# Patient Record
Sex: Male | Born: 1995 | Race: White | Hispanic: No | Marital: Single | Smoking: Current every day smoker
Health system: Southern US, Community
[De-identification: ages and names within clinical notes are randomized; demographics above are authoritative.]

---
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
## 2014-09-12 ENCOUNTER — Other Ambulatory Visit: Payer: Self-pay | Admitting: Orthopaedic Surgery

## 2014-09-12 DIAGNOSIS — M545 Low back pain, unspecified: Secondary | ICD-10-CM

## 2014-09-12 DIAGNOSIS — G8929 Other chronic pain: Secondary | ICD-10-CM

## 2014-09-12 DIAGNOSIS — M79604 Pain in right leg: Secondary | ICD-10-CM

## 2014-10-04 ENCOUNTER — Ambulatory Visit
Admission: RE | Admit: 2014-10-04 | Discharge: 2014-10-04 | Disposition: A | Discharge status: Home / Self Care | Payer: Worker's Compensation | Source: Ambulatory Visit | Attending: Orthopaedic Surgery | Admitting: Orthopaedic Surgery

## 2014-10-04 VITALS — BP 172/93 | HR 70

## 2014-10-04 DIAGNOSIS — M545 Low back pain: Secondary | ICD-10-CM

## 2014-10-04 DIAGNOSIS — M79604 Pain in right leg: Secondary | ICD-10-CM

## 2014-10-04 DIAGNOSIS — G8929 Other chronic pain: Secondary | ICD-10-CM

## 2014-10-04 DIAGNOSIS — M5416 Radiculopathy, lumbar region: Secondary | ICD-10-CM

## 2014-10-04 MED ORDER — METHYLPREDNISOLONE ACETATE 40 MG/ML INJ SUSP (RADIOLOG
120.0000 mg | Freq: Once | INTRAMUSCULAR | Status: AC
  Administered 2014-10-04: 120 mg via EPIDURAL

## 2014-10-04 MED ORDER — IOHEXOL 180 MG/ML  SOLN
1.0000 mL | Freq: Once | INTRAMUSCULAR | Status: DC | PRN
  Administered 2014-10-04: 1 mL via EPIDURAL

## 2014-10-04 NOTE — Discharge Instructions (Signed)

## 2017-02-07 ENCOUNTER — Encounter: Payer: Self-pay | Admitting: Family Medicine

## 2017-04-27 ENCOUNTER — Ambulatory Visit: Payer: Self-pay | Admitting: Family Medicine

## 2019-03-18 ENCOUNTER — Emergency Department (HOSPITAL_COMMUNITY): Payer: Self-pay

## 2019-03-18 ENCOUNTER — Encounter (HOSPITAL_COMMUNITY): Payer: Self-pay

## 2019-03-18 ENCOUNTER — Emergency Department (HOSPITAL_COMMUNITY)
Admission: EM | Admit: 2019-03-18 | Discharge: 2019-03-19 | Disposition: A | Discharge status: Other Acute Inpatient Hospital | Payer: Self-pay | Source: Home / Self Care | Attending: Emergency Medicine | Admitting: Emergency Medicine

## 2019-03-18 ENCOUNTER — Other Ambulatory Visit: Payer: Self-pay

## 2019-03-18 ENCOUNTER — Emergency Department (HOSPITAL_COMMUNITY): Payer: BC Managed Care – PPO

## 2019-03-18 DIAGNOSIS — F19959 Other psychoactive substance use, unspecified with psychoactive substance-induced psychotic disorder, unspecified: Secondary | ICD-10-CM

## 2019-03-18 DIAGNOSIS — F1910 Other psychoactive substance abuse, uncomplicated: Secondary | ICD-10-CM | POA: Insufficient documentation

## 2019-03-18 DIAGNOSIS — Z20822 Contact with and (suspected) exposure to covid-19: Secondary | ICD-10-CM | POA: Insufficient documentation

## 2019-03-18 DIAGNOSIS — F061 Catatonic disorder due to known physiological condition: Secondary | ICD-10-CM

## 2019-03-18 DIAGNOSIS — F54 Psychological and behavioral factors associated with disorders or diseases classified elsewhere: Secondary | ICD-10-CM | POA: Insufficient documentation

## 2019-03-18 DIAGNOSIS — R4182 Altered mental status, unspecified: Secondary | ICD-10-CM | POA: Insufficient documentation

## 2019-03-18 DIAGNOSIS — Z79899 Other long term (current) drug therapy: Secondary | ICD-10-CM | POA: Insufficient documentation

## 2019-03-18 DIAGNOSIS — F17210 Nicotine dependence, cigarettes, uncomplicated: Secondary | ICD-10-CM | POA: Insufficient documentation

## 2019-03-18 DIAGNOSIS — F29 Unspecified psychosis not due to a substance or known physiological condition: Secondary | ICD-10-CM | POA: Insufficient documentation

## 2019-03-18 LAB — RESPIRATORY PANEL BY RT PCR (FLU A&B, COVID)
Influenza A by PCR: NEGATIVE
Influenza B by PCR: NEGATIVE
SARS Coronavirus 2 by RT PCR: NEGATIVE

## 2019-03-18 LAB — CBC WITH DIFFERENTIAL/PLATELET
Abs Immature Granulocytes: 0.05 10*3/uL (ref 0.00–0.07)
Basophils Absolute: 0.1 10*3/uL (ref 0.0–0.1)
Basophils Relative: 1 %
Eosinophils Absolute: 0 10*3/uL (ref 0.0–0.5)
Eosinophils Relative: 0 %
HCT: 47.3 % (ref 39.0–52.0)
Hemoglobin: 16.5 g/dL (ref 13.0–17.0)
Immature Granulocytes: 0 %
Lymphocytes Relative: 16 %
Lymphs Abs: 2.3 10*3/uL (ref 0.7–4.0)
MCH: 31.8 pg (ref 26.0–34.0)
MCHC: 34.9 g/dL (ref 30.0–36.0)
MCV: 91.1 fL (ref 80.0–100.0)
Monocytes Absolute: 1.1 10*3/uL — ABNORMAL HIGH (ref 0.1–1.0)
Monocytes Relative: 8 %
Neutro Abs: 10.5 10*3/uL — ABNORMAL HIGH (ref 1.7–7.7)
Neutrophils Relative %: 75 %
Platelets: 457 10*3/uL — ABNORMAL HIGH (ref 150–400)
RBC: 5.19 MIL/uL (ref 4.22–5.81)
RDW: 12.4 % (ref 11.5–15.5)
WBC: 14.1 10*3/uL — ABNORMAL HIGH (ref 4.0–10.5)
nRBC: 0 % (ref 0.0–0.2)

## 2019-03-18 LAB — COMPREHENSIVE METABOLIC PANEL
ALT: 32 U/L (ref 0–44)
AST: 15 U/L (ref 15–41)
Albumin: 4.8 g/dL (ref 3.5–5.0)
Alkaline Phosphatase: 112 U/L (ref 38–126)
Anion gap: 10 (ref 5–15)
BUN: 17 mg/dL (ref 6–20)
CO2: 22 mmol/L (ref 22–32)
Calcium: 9.7 mg/dL (ref 8.9–10.3)
Chloride: 108 mmol/L (ref 98–111)
Creatinine, Ser: 0.78 mg/dL (ref 0.61–1.24)
GFR calc Af Amer: >60 mL/min (ref >60)
GFR calc non Af Amer: >60 mL/min (ref >60)
Glucose, Bld: 104 mg/dL — ABNORMAL HIGH (ref 70–99)
Potassium: 3.4 mmol/L — ABNORMAL LOW (ref 3.5–5.1)
Sodium: 140 mmol/L (ref 135–145)
Total Bilirubin: 1.1 mg/dL (ref 0.3–1.2)
Total Protein: 8.3 g/dL — ABNORMAL HIGH (ref 6.5–8.1)

## 2019-03-18 LAB — RAPID URINE DRUG SCREEN, HOSP PERFORMED
Amphetamines: POSITIVE — AB
Barbiturates: NOT DETECTED
Benzodiazepines: NOT DETECTED
Cocaine: NOT DETECTED
Opiates: NOT DETECTED
Tetrahydrocannabinol: POSITIVE — AB

## 2019-03-18 LAB — URINALYSIS, ROUTINE W REFLEX MICROSCOPIC
Bacteria, UA: NONE SEEN
Bilirubin Urine: NEGATIVE
Glucose, UA: NEGATIVE mg/dL
Hgb urine dipstick: NEGATIVE
Ketones, ur: 5 mg/dL — AB
Leukocytes,Ua: NEGATIVE
Nitrite: NEGATIVE
Protein, ur: 30 mg/dL — AB
Specific Gravity, Urine: 1.032 — ABNORMAL HIGH (ref 1.005–1.030)
pH: 5 (ref 5.0–8.0)

## 2019-03-18 LAB — ETHANOL: Alcohol, Ethyl (B): <10 mg/dL (ref <10)

## 2019-03-18 LAB — SALICYLATE LEVEL: Salicylate Lvl: <7 mg/dL — ABNORMAL LOW (ref 7.0–30.0)

## 2019-03-18 LAB — ACETAMINOPHEN LEVEL: Acetaminophen (Tylenol), Serum: <10 ug/mL — ABNORMAL LOW (ref 10–30)

## 2019-03-18 MED ORDER — LORAZEPAM 2 MG/ML IJ SOLN
1.0000 mg | Freq: Once | INTRAMUSCULAR | Status: AC
  Administered 2019-03-18: 1 mg via INTRAMUSCULAR
  Filled 2019-03-18: qty 1

## 2019-03-18 NOTE — ED Notes (Signed)
Patient attempting to leave multiple times and redirected by family friend. PA made aware.   Patient is not SI or HI.  Family made aware we can not stop patient from leaving and he will need to be IVCd by family if that is the care per PA.

## 2019-03-18 NOTE — ED Notes (Signed)
Patient had attempt to leave TCU multiple times. Redirect patient back to room but was later unsuccessful. MD informed, securities outside the room 28. IVC paper in process. Sitter in view of patient.

## 2019-03-18 NOTE — ED Triage Notes (Addendum)
Patient brought in by a close friend.   C/o hallucinations that started Tuesday night  Patient will not talk to triage nurse Per patients close family friend patient has not been talking as much and may nod his head.   Patient has been using roxys, benzos, percs, K2, and weed.   HX. Drug use and anxiety
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
## 2019-03-19 ENCOUNTER — Encounter (HOSPITAL_COMMUNITY): Payer: Self-pay | Admitting: Registered Nurse

## 2019-03-19 ENCOUNTER — Other Ambulatory Visit: Payer: Self-pay

## 2019-03-19 ENCOUNTER — Encounter (HOSPITAL_COMMUNITY): Payer: Self-pay | Admitting: Psychiatry

## 2019-03-19 ENCOUNTER — Inpatient Hospital Stay (HOSPITAL_COMMUNITY)
Admission: AD | Admit: 2019-03-19 | Discharge: 2019-03-23 | DRG: 897 | Disposition: A | Discharge status: Rehabilitation Facility | Payer: Federal, State, Local not specified - Other | Attending: Psychiatry | Admitting: Psychiatry

## 2019-03-19 DIAGNOSIS — R4182 Altered mental status, unspecified: Secondary | ICD-10-CM | POA: Diagnosis present

## 2019-03-19 DIAGNOSIS — F13239 Sedative, hypnotic or anxiolytic dependence with withdrawal, unspecified: Secondary | ICD-10-CM | POA: Diagnosis present

## 2019-03-19 DIAGNOSIS — F29 Unspecified psychosis not due to a substance or known physiological condition: Secondary | ICD-10-CM | POA: Diagnosis present

## 2019-03-19 DIAGNOSIS — F19959 Other psychoactive substance use, unspecified with psychoactive substance-induced psychotic disorder, unspecified: Secondary | ICD-10-CM | POA: Diagnosis not present

## 2019-03-19 DIAGNOSIS — F17210 Nicotine dependence, cigarettes, uncomplicated: Secondary | ICD-10-CM | POA: Diagnosis present

## 2019-03-19 DIAGNOSIS — Z20822 Contact with and (suspected) exposure to covid-19: Secondary | ICD-10-CM | POA: Diagnosis present

## 2019-03-19 DIAGNOSIS — F54 Psychological and behavioral factors associated with disorders or diseases classified elsewhere: Secondary | ICD-10-CM | POA: Diagnosis present

## 2019-03-19 DIAGNOSIS — Z79899 Other long term (current) drug therapy: Secondary | ICD-10-CM

## 2019-03-19 LAB — LIPID PANEL
Cholesterol: 163 mg/dL (ref 0–200)
Cholesterol: 145 mg/dL (ref 0–200)
HDL: 32 mg/dL — ABNORMAL LOW (ref >40)
HDL: 31 mg/dL — ABNORMAL LOW (ref >40)
LDL Cholesterol: 119 mg/dL — ABNORMAL HIGH (ref 0–99)
LDL Cholesterol: 106 mg/dL — ABNORMAL HIGH (ref 0–99)
Total CHOL/HDL Ratio: 5.1 RATIO
Total CHOL/HDL Ratio: 4.7 RATIO
Triglycerides: 61 mg/dL (ref <150)
Triglycerides: 40 mg/dL (ref <150)
VLDL: 12 mg/dL (ref 0–40)
VLDL: 8 mg/dL (ref 0–40)

## 2019-03-19 LAB — TSH
TSH: 1.011 u[IU]/mL (ref 0.350–4.500)
TSH: 2.222 u[IU]/mL (ref 0.350–4.500)

## 2019-03-19 MED ORDER — OMEGA-3-ACID ETHYL ESTERS 1 G PO CAPS
1.0000 g | ORAL_CAPSULE | Freq: Two times a day (BID) | ORAL | Status: DC
  Administered 2019-03-19 – 2019-03-22 (×7): 1 g via ORAL
  Filled 2019-03-19 (×11): qty 1

## 2019-03-19 MED ORDER — BENZTROPINE MESYLATE 0.5 MG PO TABS
0.5000 mg | ORAL_TABLET | Freq: Two times a day (BID) | ORAL | Status: DC
  Administered 2019-03-19 – 2019-03-22 (×7): 0.5 mg via ORAL
  Filled 2019-03-19 (×11): qty 1

## 2019-03-19 MED ORDER — CARBAMAZEPINE 100 MG PO CHEW
100.0000 mg | CHEWABLE_TABLET | Freq: Three times a day (TID) | ORAL | Status: DC
  Administered 2019-03-19 – 2019-03-20 (×3): 100 mg via ORAL
  Filled 2019-03-19 (×9): qty 1

## 2019-03-19 MED ORDER — PRENATAL MULTIVITAMIN CH
1.0000 | ORAL_TABLET | Freq: Every day | ORAL | Status: DC
  Administered 2019-03-19 – 2019-03-22 (×4): 1 via ORAL
  Filled 2019-03-19 (×6): qty 1

## 2019-03-19 MED ORDER — TEMAZEPAM 30 MG PO CAPS
30.0000 mg | ORAL_CAPSULE | Freq: Every day | ORAL | Status: DC
  Administered 2019-03-21: 30 mg via ORAL
  Filled 2019-03-19 (×3): qty 1

## 2019-03-19 MED ORDER — LORAZEPAM 1 MG PO TABS: 2.0000 mg | ORAL_TABLET | Freq: Four times a day (QID) | ORAL | Status: DC | PRN

## 2019-03-19 MED ORDER — CLONAZEPAM 0.5 MG PO TABS
0.5000 mg | ORAL_TABLET | Freq: Three times a day (TID) | ORAL | Status: DC
  Administered 2019-03-19 – 2019-03-20 (×3): 0.5 mg via ORAL
  Filled 2019-03-19 (×3): qty 1

## 2019-03-19 MED ORDER — OLANZAPINE 2.5 MG PO TABS: 2.5000 mg | ORAL_TABLET | Freq: Two times a day (BID) | ORAL | Status: DC

## 2019-03-19 MED ORDER — HALOPERIDOL 5 MG PO TABS: 5.0000 mg | ORAL_TABLET | Freq: Four times a day (QID) | ORAL | Status: DC | PRN

## 2019-03-19 MED ORDER — GABAPENTIN 300 MG PO CAPS
300.0000 mg | ORAL_CAPSULE | Freq: Three times a day (TID) | ORAL | Status: DC
  Administered 2019-03-19 – 2019-03-20 (×3): 300 mg via ORAL
  Filled 2019-03-19 (×9): qty 1

## 2019-03-19 MED ORDER — RISPERIDONE 3 MG PO TABS: 3.0000 mg | ORAL_TABLET | Freq: Two times a day (BID) | ORAL | Status: DC

## 2019-03-19 MED ORDER — RISPERIDONE 2 MG PO TABS
2.0000 mg | ORAL_TABLET | Freq: Two times a day (BID) | ORAL | Status: DC
  Administered 2019-03-19 – 2019-03-22 (×7): 2 mg via ORAL
  Filled 2019-03-19 (×11): qty 1

## 2019-03-19 MED ORDER — CLONAZEPAM 0.5 MG PO TABS: 0.5000 mg | ORAL_TABLET | Freq: Three times a day (TID) | ORAL | Status: DC

## 2019-03-19 MED ORDER — HALOPERIDOL LACTATE 5 MG/ML IJ SOLN: 10.0000 mg | Freq: Four times a day (QID) | INTRAMUSCULAR | Status: DC | PRN

## 2019-03-19 MED ORDER — LORAZEPAM 2 MG/ML IJ SOLN: 2.0000 mg | Freq: Four times a day (QID) | INTRAMUSCULAR | Status: DC | PRN

## 2019-03-19 MED ORDER — OLANZAPINE 2.5 MG PO TABS
2.5000 mg | ORAL_TABLET | Freq: Two times a day (BID) | ORAL | Status: DC
  Administered 2019-03-19: 11:00:00 2.5 mg via ORAL
  Filled 2019-03-19: qty 1

## 2019-03-19 NOTE — [facility] Suicide Risk Assessment (Signed)
[facility] Admission Suicide Risk Assessment   Nursing information obtained from:  Patient Demographic factors:  Male, Caucasian, Adolescent or young adult Current Mental Status:  NA Loss Factors:  Decline in physical health Historical Factors:  Impulsivity Risk Reduction Factors:  Positive social support, Positive coping skills or problem solving skills, Living with another person, especially a relative, Positive therapeutic relationship  Total Time spent with patient: 45 minutes Principal Problem: <principal problem not specified> Diagnosis:  Active Problems:   Psychoactive substance-induced psychosis (HCC)   Psychotic disorder (HCC)  Subjective Data: Patient at risk due to disorganized due to mind and chronic substance abuse issues  Continued Clinical Symptoms:  Alcohol Use Disorder Identification Test Final Score (AUDIT): 3 The "Alcohol Use Disorders Identification Test", Guidelines for Use in Primary Care, Second Edition.  World Science writer [facility]). Score between 0-7:  no or low risk or alcohol related problems. Score between 8-15:  moderate risk of alcohol related problems. Score between 16-19:  high risk of alcohol related problems. Score 20 or above:  warrants further diagnostic evaluation for alcohol dependence and treatment.   CLINICAL FACTORS:   Dysthymia Alcohol/Substance Abuse/Dependencies More than one psychiatric diagnosis Currently Psychotic  Musculoskeletal: Strength & Muscle Tone: within normal limits Gait & Station: normal Patient leans: N/A  Psychiatric Specialty Exam: Physical Exam  Nursing note and vitals reviewed. Constitutional: He appears well-developed and well-nourished.  Cardiovascular: Normal rate and regular rhythm.    Review of Systems  Constitutional: Negative.   Eyes: Negative.   Respiratory: Negative.   Endocrine: Negative.   Genitourinary: Negative.   Neurological: Negative.   Hematological: Negative.     Blood pressure 115/77,  pulse (!) 109, temperature 98.2 F (36.8 C), temperature source Oral, resp. rate 18, height 6\' 3"  (1.905 m), weight 97.5 kg, SpO2 100 %.Body mass index is 26.87 kg/m.  General Appearance: Casual  Eye Contact:  Fair  Speech:  Blocked  Volume:  Decreased  Mood:  Dysphoric  Affect:  Restricted  Thought Process:  Disorganized and Descriptions of Associations: Circumstantial  Orientation:  Other:  Person place situation  Thought Content:  Illogical, Delusions, Hallucinations: Auditory Visual and Paranoid Ideation  Suicidal Thoughts:  No  Homicidal Thoughts:  No  Memory:  Immediate;   Poor Recent;   Poor  Judgement:  Impaired  Insight:  Shallow  Psychomotor Activity:  Decreased  Concentration:  Concentration: Fair and Attention Span: Poor  Recall:  Poor  Fund of Knowledge:  Poor  Language:  Fair  Akathisia:  Negative  Handed:  Right  AIMS (if indicated):     Assets:  Communication Skills Leisure Time Physical Health Resilience Social Support  ADL's:  Intact  Cognition:  WNL  Sleep:           COGNITIVE FEATURES THAT CONTRIBUTE TO RISK:  Closed-mindedness and Loss of executive function    SUICIDE RISK:   Mild:  Suicidal ideation of limited frequency, intensity, duration, and specificity.  There are no identifiable plans, no associated intent, mild dysphoria and related symptoms, good self-control (both objective and subjective assessment), few other risk factors, and identifiable protective factors, including available and accessible social support.  PLAN OF CARE: Patient is at risk for self-harm inadvertently due to his disorganized state of mind  I certify that inpatient services furnished can reasonably be expected to improve the patient's condition.   , MD 03/19/2019, 2:12 PM

## 2019-03-19 NOTE — [facility] Group Notes (Signed)
[employer] LCSW Group Therapy Note  Date/Time: 03/14/2018 @ 1:30pm  Type of Therapy/Topic:  Group Therapy:  Feelings about Diagnosis  Participation Level:  None   Mood: Pleasant   Description of Group:    This group will allow patients to explore their thoughts and feelings about diagnoses they have received. Patients will be guided to explore their level of understanding and acceptance of these diagnoses. Facilitator will encourage patients to process their thoughts and feelings about the reactions of others to their diagnosis, and will guide patients in identifying ways to discuss their diagnosis with significant others in their lives. This group will be process-oriented, with patients participating in exploration of their own experiences as well as giving and receiving support and challenge from other group members.   Therapeutic Goals: 1. Patient will demonstrate understanding of diagnosis as evidence by identifying two or more symptoms of the disorder:  2. Patient will be able to express two feelings regarding the diagnosis 3. Patient will demonstrate ability to communicate their needs through discussion and/or role plays  Summary of Patient Progress:    Patient sat during group but did not engage or talk. Patient looked down at some papers and did not even answer CSW when asked what his name is.     Therapeutic Modalities:   Cognitive Behavioral Therapy Brief Therapy Feelings Identification   Stephannie Peters, LCSW

## 2019-03-19 NOTE — BH Assessment (Signed)
[facility] Assessment Progress Note  Per Shuvon Rankin, FNP, this pt requires psychiatric hospitalization.  Jasmine has assigned pt to [facility] Rm 503-2.  Pt presents under IVC initiated by EDP Lorre Nick, MD, and IVC documents have been faxed to [facility].  Pt's nurse, Kendal Hymen, has been notified, and agrees to call report to [phone].  Pt is to be transported via [employer].   Doylene Canning, [place] [facility] Coordinator [phone]

## 2019-03-19 NOTE — Progress Notes (Signed)
Patient ID: Andrew Christensen, male   DOB: 09-07-1995, 24 y.o.   MRN: [id number] Admission note  Pt is a 24 yo male that presents IVC'd on 03/19/2019 with worsening paranoia, anxiety, substance use/abuse, and hallucinations. Pt is a poor historian and states they "should have just stayed in their bed". Pt would/could not elaborate on this. Pt seems to be thought blocking and endorses auditory hallucinations. Pt will not elaborate on what they hear. Pt endorses a pcp and states they have been prescribed adderall. Pt endorses cannabis use. Pt endorses occ alcohol use. Pt endorses 0.5-2 ppd. Pt states they see a Dr. Eston Esters for medication management. Pt says they saw them 6 months ago. Pt denies past/present verbal/physical/sexual abuse. Pt endorses current self neglect. Pt denies si/hi and verbally agrees to approach staff if these become apparent or before harming themself/others while at bhh. Consents signed, skin/belongings search completed and patient oriented to unit. Patient stable at this time. Patient given the opportunity to express concerns and ask questions. Patient given toiletries. Will continue to monitor.   [facility] Assessment 03/19/2019:  [name] is a 24 y.o. male who was brought to [facility] by a family/close friend due to concerns regarding pt's recent behaviors. Pt's friend states pt was experimenting with marijuana, benzos, percocets, roxys, and K2 and on Tuesday began hallucinating and stopped sleeping for several days. She shares pt has not been communicating as much and has only been nodding his head. In triage, pt's nurse states "pt is not SI or HI."  Clinician explained the assessment process and attempted to confirm pt's name and date of birth. Pt did not respond to his name and pt's nurse informed clinician that pt goes by "[name]." Clinician called pt by his preferred name and requested he provide clinician his birthday; pt looked at clinician for a moment, blinking, and provided his  birthday. This line of questioning and answering continued when clinician asked pt the the date, the city his is in, and where he is. Pt stated the date is 1/14 and was unable to provide the year. Clinician requested pt share his insight regarding why he is at the hospital, and pt stared at clinician, blinking, but did not answer. Clinician inquired as to whom brought pt to the hospital, what led to pt coming to the hospital, and if pt had been using substances, but pt continued to stare at clinician and blink. Pt was able to inform clinician that his mother lives in [place], but he was unable to provide consent for clinician to call her.  Pt is currently most likely recovering from the effects of the use of the substances he used earlier in the week. Pt was unable to provide additional information to assist in completing this assessment, and he was unable/unwilling to provide information for clinician to contact a family member for collateral.

## 2019-03-19 NOTE — Progress Notes (Signed)
   03/19/19 2100  Psych Admission Type (Psych Patients Only)  Admission Status Voluntary  Psychosocial Assessment  Patient Complaints Anxiety  Eye Contact Avertive;Brief  Facial Expression Anxious;Pensive;Worried  Affect Anxious;Sad;Sullen  Speech Aphasic  Interaction Cautious;Forwards little;Guarded;Minimal  Motor Activity Slow;Unsteady  Appearance/Hygiene Unremarkable  Behavior Characteristics Cooperative  Mood Anxious;Preoccupied;Suspicious  Thought Process  Coherency Blocking  Content Paranoia  Delusions Paranoid  Perception Hallucinations  Hallucination Auditory  Judgment Poor  Confusion Moderate  Danger to Self  Current suicidal ideation? Denies  Danger to Others  Danger to Others None reported or observed
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
## 2019-03-19 NOTE — Discharge Summary (Addendum)
  Attempted to see patient via tele psych.  Patient elevated in bed, flat affect.  Patient would not answer any questions.   When asked what brought to hospital patient made a vague statement about "Piss or shit."  Nursing reports that patient has not been talking.  States that patient will come out of his room confused and has to be redirected back to room.  Ordered TSH, and Lipid panel.  Started Zyprexa 2.5 mg Bid for psychosis.  Disposition: Recommend psychiatric Inpatient admission when medically cleared.   Patient has been accepted to [facility].  Patient to be transferred to [facility] for inpatient psychiatric treatment.   Attest to NP Note
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
## 2019-03-19 NOTE — Tx Team (Signed)
Initial Treatment Plan 03/19/2019 1:47 PM RADIN RAPTIS YBO:[id number]    PATIENT STRESSORS: Health problems Medication change or noncompliance Substance abuse   PATIENT STRENGTHS: Average or above average intelligence Motivation for treatment/growth Physical Health Supportive family/friends   PATIENT IDENTIFIED PROBLEMS: anxiety  paranoia  Substance use                 DISCHARGE CRITERIA:  Ability to meet basic life and health needs Improved stabilization in mood, thinking, and/or behavior Motivation to continue treatment in a less acute level of care Need for constant or close observation no longer present  PRELIMINARY DISCHARGE PLAN: Attend 12-step recovery group Return to previous living arrangement Return to previous work or school arrangements  PATIENT/FAMILY INVOLVEMENT: This treatment plan has been presented to and reviewed with the patient, Andrew Christensen.  The patient and family have been given the opportunity to ask questions and make suggestions.  Raylene Miyamoto, RN 03/19/2019, 1:47 PM

## 2019-03-20 DIAGNOSIS — F19959 Other psychoactive substance use, unspecified with psychoactive substance-induced psychotic disorder, unspecified: Secondary | ICD-10-CM | POA: Diagnosis not present

## 2019-03-20 MED ORDER — CLONAZEPAM 1 MG PO TABS
1.0000 mg | ORAL_TABLET | Freq: Two times a day (BID) | ORAL | Status: DC
  Administered 2019-03-21: 1 mg via ORAL
  Filled 2019-03-20: qty 1

## 2019-03-20 MED ORDER — PROPRANOLOL HCL 20 MG PO TABS
20.0000 mg | ORAL_TABLET | Freq: Two times a day (BID) | ORAL | Status: DC
  Administered 2019-03-20 – 2019-03-22 (×5): 20 mg via ORAL
  Filled 2019-03-20 (×8): qty 1

## 2019-03-20 MED ORDER — CARBAMAZEPINE 100 MG PO CHEW
200.0000 mg | CHEWABLE_TABLET | Freq: Three times a day (TID) | ORAL | Status: DC
  Administered 2019-03-20 – 2019-03-22 (×7): 200 mg via ORAL
  Filled 2019-03-20 (×11): qty 2

## 2019-03-20 NOTE — [facility] Suicide Risk Assessment (Signed)
[facility] INPATIENT:  Family/Significant Other Suicide Prevention Education  Suicide Prevention Education:  Contact Attempts: Pt's father, Emanuele Mcwhirter,  has been identified by the patient as the family member/significant other with whom the patient will be residing, and identified as the person(s) who will aid the patient in the event of a mental health crisis.  With written consent from the patient, two attempts were made to provide suicide prevention education, prior to and/or following the patient's discharge.  We were unsuccessful in providing suicide prevention education.  A suicide education pamphlet was given to the patient to share with family/significant other.  Date and time of first attempt: 03/20/2019 @ 3:15pm (left voicemail) Date and time of second attempt:  Delphia Grates 03/20/2019, 3:16 PM

## 2019-03-20 NOTE — Progress Notes (Signed)
Pt up walking around with his bag in his hand repeating his name. Pt asked what's going on with him, he just repeats he is ready to go home. Pt informed that the doctor is the only one that can D/C him and he will be in this morning , but informed that he would probably not be leaving today.

## 2019-03-20 NOTE — Progress Notes (Addendum)
BH MD/PA/NP OP Progress Note  03/20/2019 8:56 AM Andrew Christensen  MRN:  [id number]  Chief Complaint: Psychotic disorder [facility]) [F29] HPI: Mr. Andrew Christensen is sitting in the day room watching television. He states he barely slept last night because he kept having dreams, but he is unable to describe the dreams. He is slow to respond to questions and often stares into the distance until his name is called. There is significant thought blocking. He states he is seeing people who he believes aren't there and says they do not tell him to do anything harmful but they talk about him. He denies SI/HI/AH.   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  Past Medical History: History reviewed. No pertinent past medical history. History reviewed. No pertinent surgical history.  Family Psychiatric History: Mother addicted to opiates.   Family History: History reviewed. No pertinent family history.  Social History:  Social History   Socioeconomic History  . Marital status: Single    Spouse name: Not on file  . Number of children: Not on file  . Years of education: Not on file  . Highest education level: Not on file  Occupational History  . Not on file  Tobacco Use  . Smoking status: Current Every Day Smoker    Packs/day: 1.00    Types: Cigarettes  . Smokeless tobacco: Never Used  Substance and Sexual Activity  . Alcohol use: Yes    Comment: occ  . Drug use: Yes    Types: Marijuana  . Sexual activity: Not Currently  Other Topics Concern  . Not on file  Social History Narrative  . Not on file   Social Determinants of Health   Financial Resource Strain:   . Difficulty of Paying Living Expenses: Not on file  Food Insecurity:   . Worried About  Programme researcher, broadcasting/film/video in the Last Year: Not on file  . Ran Out of Food in the Last Year: Not on file  Transportation Needs:   . Lack of Transportation (Medical): Not on file  . Lack of Transportation (Non-Medical): Not on file  Physical Activity:   . Days of Exercise per Week: Not on file  . Minutes of Exercise per Session: Not on file  Stress:   . Feeling of Stress : Not on file  Social Connections:   . Frequency of Communication with Friends and Family: Not on file  . Frequency of Social Gatherings with Friends and Family: Not on file  . Attends Religious Services: Not on file  . Active Member of Clubs or Organizations: Not on file  . Attends Banker Meetings: Not on file  . Marital Status: Not on file    Allergies: No Known Allergies  Metabolic Disorder Labs: No results found for: HGBA1C, MPG No results found for: PROLACTIN Lab Results  Component Value Date   CHOL 145 03/19/2019   TRIG 40 03/19/2019   HDL 31 (L) 03/19/2019   CHOLHDL 4.7 03/19/2019   VLDL 8 03/19/2019   LDLCALC 106 (H) 03/19/2019   LDLCALC 119 (H) 03/19/2019   Lab Results  Component Value Date   TSH 2.222 03/19/2019   TSH 1.011 03/19/2019    Therapeutic Level Labs: No results found for: LITHIUM No results found for: VALPROATE No components found for:  CBMZ  Current Medications: Current Facility-Administered Medications  Medication Dose Route Frequency Provider Last Rate Last Admin  . benztropine (COGENTIN) tablet 0.5 mg  0.5 mg Oral BID Johnn Hai, MD   0.5 mg at 03/20/19 0815  . carbamazepine (TEGRETOL) chewable tablet 100 mg  100 mg Oral TID Johnn Hai, MD   100 mg at 03/20/19 0816  . clonazePAM (KLONOPIN) tablet 0.5 mg  0.5 mg Oral TID Johnn Hai, MD   0.5 mg at 03/20/19 0816  . gabapentin (NEURONTIN) capsule 300 mg  300 mg Oral TID Johnn Hai, MD   300 mg at 03/20/19 0815  . haloperidol (HALDOL) tablet 5 mg  5 mg Oral Q6H PRN Johnn Hai, MD       Or  . haloperidol  lactate (HALDOL) injection 10 mg  10 mg Intramuscular Q6H PRN Johnn Hai, MD      . omega-3 acid ethyl esters (LOVAZA) capsule 1 g  1 g Oral BID Johnn Hai, MD   1 g at 03/20/19 0815  . prenatal multivitamin tablet 1 tablet  1 tablet Oral Daily Johnn Hai, MD   1 tablet at 03/20/19 0815  . risperiDONE (RISPERDAL) tablet 2 mg  2 mg Oral BID Johnn Hai, MD   2 mg at 03/20/19 0815  . temazepam (RESTORIL) capsule 30 mg  30 mg Oral QHS Johnn Hai, MD         Musculoskeletal: Christensen & Muscle Tone: within normal limits Gait & Station: normal Patient leans: N/A  Psychiatric Specialty Exam: Review of Systems  Blood pressure 134/85, pulse (!) 169, temperature (!) 97.5 F (36.4 C), temperature source Oral, resp. rate 18, height 6\' 3"  (1.905 m), weight 97.5 kg, SpO2 100 %.Body mass index is 26.87 kg/m.  General Appearance: Bizarre and Fairly Groomed  Eye Contact:  Fair  Speech:  Garbled  Volume:  Decreased  Mood:  Negative, Depressed and Dysphoric  Affect:  Flat and Restricted  Thought Process:  Disorganized  Orientation:  Other:  Pt oriented to month and year.  Thought Content: Hallucinations: Visual   Suicidal Thoughts:  No  Homicidal Thoughts:  No  Memory:  Immediate;   Fair  Judgement:  Fair  Insight:  Fair  Psychomotor Activity:  Normal  Concentration:  Attention Span: Poor  Recall:  [employer] of Knowledge: Fair  Language: Good  Akathisia:  No  Handed:  Right  AIMS (if indicated):  Assets:  Desire for Improvement Leisure Time Physical Health Social Support  ADL's:  Intact  Cognition: WNL  Sleep:  Fair   Screenings: AIMS     Admission (Current) from 03/19/2019 in [employer] [facility]  AIMS Total Score  0    AUDIT     Admission (Current) from 03/19/2019 in [place] [facility]  Alcohol Use Disorder Identification Test Final Score (AUDIT)  3       Assessment and Plan: Plan to continue medication regimen   and monitor progress daily as pt withdraws from current substances.   Patient's pulse is now 128 at 1:47 PM so I think it represents some benzodiazepine withdrawal we will escalate temper  Deanna Artis, Medical Student 03/20/2019, 8:56 AM

## 2019-03-20 NOTE — Progress Notes (Signed)
Recreation Therapy Notes  Date: 1.26.21 Time: 1000 Location: 500 Hall Dayroom  Group Topic: Coping Skills  Goal Area(s) Addresses:  Patient will identify healthy and unhealthy coping strategies. Patient will identify benefit of using healthy coping strategies.  Intervention: Worksheet  Activity: Healthy vs. Unhealthy Coping Strategies.  Patients were to identify a current problem they are faced with, unhealthy coping strategies they have used to address the problem and the consequences of those coping strategies.  Patients then identified healthy coping strategies they can use, expected outcomes and what stops them from using the positive coping strategies.  Education: Coping Skills, Discharge Planning.   Education Outcome: Acknowledges understanding/In group clarification offered/Needs additional education.   Clinical Observations/Feedback: Pt did not attend group session.    Saki Legore, LRT/CTRS         Shanik Brookshire A 03/20/2019 12:03 PM 

## 2019-03-20 NOTE — Progress Notes (Signed)
   03/20/19 2100  Psych Admission Type (Psych Patients Only)  Admission Status Voluntary  Psychosocial Assessment  Patient Complaints Substance abuse  Eye Contact Avertive;Brief  Facial Expression Anxious;Pensive;Worried  Affect Anxious;Sad;Sullen  Speech Slow  Interaction Cautious;Forwards little;Guarded;Minimal  Motor Activity Slow  Appearance/Hygiene Unremarkable  Behavior Characteristics Cooperative  Mood Preoccupied  Thought Process  Coherency Blocking  Content Paranoia  Delusions Paranoid  Perception Hallucinations  Hallucination Auditory  Judgment Poor  Confusion Moderate  Danger to Self  Current suicidal ideation? Denies  Danger to Others  Danger to Others None reported or observed   Pt minimizing, pt stated he was doing fine

## 2019-03-20 NOTE — Progress Notes (Signed)
    03/20/19 0900  Psych Admission Type (Psych Patients Only)  Admission Status Voluntary  Psychosocial Assessment  Patient Complaints Substance abuse  Eye Contact Avertive;Brief  Facial Expression Anxious;Pensive;Worried  Affect Anxious;Sad;Sullen  Speech Slow  Interaction Cautious;Forwards little;Guarded;Minimal  Motor Activity Slow  Appearance/Hygiene Unremarkable  Behavior Characteristics Cooperative  Mood Preoccupied  Thought Process  Coherency Blocking  Content Paranoia  Delusions Paranoid  Perception Hallucinations  Hallucination Auditory  Judgment Poor  Confusion Moderate  Danger to Self  Current suicidal ideation? Denies  Danger to Others  Danger to Others None reported or observed

## 2019-03-21 DIAGNOSIS — F19959 Other psychoactive substance use, unspecified with psychoactive substance-induced psychotic disorder, unspecified: Secondary | ICD-10-CM | POA: Diagnosis not present

## 2019-03-21 MED ORDER — NICOTINE 21 MG/24HR TD PT24
21.0000 mg | MEDICATED_PATCH | Freq: Every day | TRANSDERMAL | Status: DC
  Administered 2019-03-22: 21 mg via TRANSDERMAL
  Filled 2019-03-21 (×3): qty 1

## 2019-03-21 MED ORDER — CLONAZEPAM 0.5 MG PO TABS
0.5000 mg | ORAL_TABLET | Freq: Two times a day (BID) | ORAL | Status: DC
  Administered 2019-03-21 – 2019-03-22 (×3): 0.5 mg via ORAL
  Filled 2019-03-21 (×3): qty 1

## 2019-03-21 NOTE — Progress Notes (Signed)
Patient denies SI, HI and AVH this shift.  Patient had no incidents of behavioral dyscontrol this shift.  Patient has been compliant with medications and engaged in groups and unit activities.   Assess patient for safety, offer medications as prescribed, engage patient in 1:1 staff talks.   Continue to monitor as planned. Patient able  to contract for safety 

## 2019-03-21 NOTE — Progress Notes (Signed)
Recreation Therapy Notes  Date: 1.27.21 Time: 1000 Location: 500 Hall Dayroom  Group Topic: Self-Esteem  Goal Area(s) Addresses:  Patient will successfully identify positive attributes about themselves.  Patient will successfully identify benefit of improved self-esteem.   Behavioral Response: Engaged  Intervention: Worksheet, colored pencils, music  Activity: Crest of Arms.  Patients were to identify four things that are important to them and place them in the crest in a creative way.  Patients could identify important dates, accomplishments, people, etc.  Education:  Self-Esteem, Building control surveyor.   Education Outcome: Acknowledges education/In group clarification offered/Needs additional education  Clinical Observations/Feedback: Pt was engaged, bright and talking.  Pt expressed some of the things that are important to him are the date he was born (1995-09-02) and the date he changed his life and became sober.  Pt also highlighted the saying "storms make you stronger" and expressed his appreciation for rain.  Pt also sang along to the music and danced.    Caroll Rancher, LRT/CTRS     Caroll Rancher A 03/21/2019 11:24 AM

## 2019-03-21 NOTE — Tx Team (Signed)
Interdisciplinary Treatment and Diagnostic Plan Update  03/21/2019 Time of Session: 9:15am  Andrew Christensen MRN: [id number]  Principal Diagnosis: <principal problem not specified>  Secondary Diagnoses: Active Problems:   Psychoactive substance-induced psychosis ([place])   Psychotic disorder ([place])   Current Medications:  Current Facility-Administered Medications  Medication Dose Route Frequency Provider Last Rate Last Admin  . benztropine (COGENTIN) tablet 0.5 mg  0.5 mg Oral BID Johnn Hai, MD   0.5 mg at 03/21/19 2536  . carbamazepine (TEGRETOL) chewable tablet 200 mg  200 mg Oral TID Johnn Hai, MD   200 mg at 03/21/19 6440  . clonazePAM (KLONOPIN) tablet 1 mg  1 mg Oral BID Johnn Hai, MD   1 mg at 03/21/19 3474  . haloperidol (HALDOL) tablet 5 mg  5 mg Oral Q6H PRN Johnn Hai, MD       Or  . haloperidol lactate (HALDOL) injection 10 mg  10 mg Intramuscular Q6H PRN Johnn Hai, MD      . omega-3 acid ethyl esters (LOVAZA) capsule 1 g  1 g Oral BID Johnn Hai, MD   1 g at 03/21/19 2595  . prenatal multivitamin tablet 1 tablet  1 tablet Oral Daily Johnn Hai, MD   1 tablet at 03/21/19 [phone]  . propranolol (INDERAL) tablet 20 mg  20 mg Oral BID Johnn Hai, MD   20 mg at 03/21/19 5643  . risperiDONE (RISPERDAL) tablet 2 mg  2 mg Oral BID Johnn Hai, MD   2 mg at 03/21/19 3295  . temazepam (RESTORIL) capsule 30 mg  30 mg Oral QHS Johnn Hai, MD       PTA Medications: Medications Prior to Admission  Medication Sig Dispense Refill Last Dose  . sildenafil (REVATIO) 20 MG tablet Take 60 mg by mouth daily as needed (erectile dysfunction). Max 3 tabs daily        Patient Stressors: Health problems Medication change or noncompliance Substance abuse  Patient Strengths: Average or above average intelligence Motivation for treatment/growth Physical Health Supportive family/friends  Treatment Modalities: Medication Management, Group therapy, Case management,  1 to 1  session with clinician, Psychoeducation, Recreational therapy.   Physician Treatment Plan for Primary Diagnosis: <principal problem not specified> Long Term Goal(s): Improvement in symptoms so as ready for discharge Improvement in symptoms so as ready for discharge   Short Term Goals: Ability to demonstrate self-control will improve Ability to identify and develop effective coping behaviors will improve Ability to maintain clinical measurements within normal limits will improve Compliance with prescribed medications will improve Ability to identify triggers associated with substance abuse/mental health issues will improve Ability to demonstrate self-control will improve Ability to identify and develop effective coping behaviors will improve Compliance with prescribed medications will improve  Medication Management: Evaluate patient's response, side effects, and tolerance of medication regimen.  Therapeutic Interventions: 1 to 1 sessions, Unit Group sessions and Medication administration.  Evaluation of Outcomes: Not Met  Physician Treatment Plan for Secondary Diagnosis: Active Problems:   Psychoactive substance-induced psychosis ([place])   Psychotic disorder ([place])  Long Term Goal(s): Improvement in symptoms so as ready for discharge Improvement in symptoms so as ready for discharge   Short Term Goals: Ability to demonstrate self-control will improve Ability to identify and develop effective coping behaviors will improve Ability to maintain clinical measurements within normal limits will improve Compliance with prescribed medications will improve Ability to identify triggers associated with substance abuse/mental health issues will improve Ability to demonstrate self-control will improve Ability to identify  and develop effective coping behaviors will improve Compliance with prescribed medications will improve     Medication Management: Evaluate patient's response, side effects, and  tolerance of medication regimen.  Therapeutic Interventions: 1 to 1 sessions, Unit Group sessions and Medication administration.  Evaluation of Outcomes: Not Met   RN Treatment Plan for Primary Diagnosis: <principal problem not specified> Long Term Goal(s): Knowledge of disease and therapeutic regimen to maintain health will improve  Short Term Goals: Ability to participate in decision making will improve, Ability to verbalize feelings will improve, Ability to disclose and discuss suicidal ideas, Ability to identify and develop effective coping behaviors will improve and Compliance with prescribed medications will improve  Medication Management: RN will administer medications as ordered by provider, will assess and evaluate patient's response and provide education to patient for prescribed medication. RN will report any adverse and/or side effects to prescribing provider.  Therapeutic Interventions: 1 on 1 counseling sessions, Psychoeducation, Medication administration, Evaluate responses to treatment, Monitor vital signs and CBGs as ordered, Perform/monitor CIWA, COWS, AIMS and Fall Risk screenings as ordered, Perform wound care treatments as ordered.  Evaluation of Outcomes: Not Met   LCSW Treatment Plan for Primary Diagnosis: <principal problem not specified> Long Term Goal(s): Safe transition to appropriate next level of care at discharge, Engage patient in therapeutic group addressing interpersonal concerns.  Short Term Goals: Engage patient in aftercare planning with referrals and resources and Increase skills for wellness and recovery  Therapeutic Interventions: Assess for all discharge needs, 1 to 1 time with Social worker, Explore available resources and support systems, Assess for adequacy in community support network, Educate family and significant other(s) on suicide prevention, Complete Psychosocial Assessment, Interpersonal group therapy.  Evaluation of Outcomes: Not  Met   Progress in Treatment: Attending groups: No. new to unit  Participating in groups: No. Taking medication as prescribed: Yes. Toleration medication: Yes. Family/Significant other contact made: No, will contact:  pt's father  Patient understands diagnosis: Yes. Discussing patient identified problems/goals with staff: Yes. Medical problems stabilized or resolved: Yes. Denies suicidal/homicidal ideation: Yes. Issues/concerns per patient self-inventory: No. Other:   New problem(s) identified: No, Describe:  none   New Short Term/Long Term Goal(s): Medication stabilization, elimination of SI thoughts, and development of a comprehensive mental wellness plan.   Patient Goals:  "Stay drug free"  Discharge Plan or Barriers: CSW will continue to follow up for appropriate referrals and possible discharge planning  Reason for Continuation of Hospitalization: Delusions  Hallucinations Withdrawal symptoms  Estimated Length of Stay: 3-5 days   Attendees: Patient: Andrew Christensen 03/21/2019   Physician: 03/21/2019   Nursing: Vladimir Faster, RN  03/21/2019   RN Care Manager: 03/21/2019   Social Worker: Ovidio Kin, MSW intern  03/21/2019   Recreational Therapist:  03/21/2019   Other: Marcie Bal, NP 03/21/2019   Other:  03/21/2019   Other: 03/21/2019     Scribe for Treatment Team: Billey Chang, [place] [employer] 03/21/2019 9:46 AM

## 2019-03-21 NOTE — [facility] Suicide Risk Assessment (Signed)
[facility] INPATIENT:  Family/Significant Other Suicide Prevention Education  Suicide Prevention Education:  Education Completed; Pt's father, Kinsey Cowsert,  has been identified by the patient as the family member/significant other with whom the patient will be residing, and identified as the person(s) who will aid the patient in the event of a mental health crisis (suicidal ideations/suicide attempt).  With written consent from the patient, the family member/significant other has been provided the following suicide prevention education, prior to the and/or following the discharge of the patient.  The suicide prevention education provided includes the following:  Suicide risk factors  Suicide prevention and interventions  National Suicide Hotline telephone number  [facility] assessment telephone number  [facility] Emergency Assistance 911  [facility] and/or Residential Mobile Crisis Unit telephone number  Request made of family/significant other to:  Remove weapons (e.g., guns, rifles, knives), all items previously/currently identified as safety concern.    Remove drugs/medications (over-the-counter, prescriptions, illicit drugs), all items previously/currently identified as a safety concern.  The family member/significant other verbalizes understanding of the suicide prevention education information provided.  The family member/significant other agrees to remove the items of safety concern listed above.   Pt's father came to [facility] and spoke with CSW in person in the lobby. Pt's father stated that the patient has been using substances since he was 12. Pt's mother died 3 years ago from an overdose. Pt's father stated that the patient lives with him. Pt's father states that the patient needs rehab and is willing to go. Patient's father stated that 2 week program is perfect for the patient and to talk to him about it. Pt's father stated that he wants his son to get better  because he was really gone when he took him to the hospital on Sunday. Pt's father stated that he is very supportive and has an entire family behind him to support the son in this journey.   Delphia Grates 03/21/2019, 1:47 PM
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
## 2019-03-21 NOTE — Progress Notes (Signed)
   03/21/19 2100  Psych Admission Type (Psych Patients Only)  Admission Status Voluntary  Psychosocial Assessment  Patient Complaints Substance abuse  Eye Contact Avertive;Brief  Facial Expression Anxious;Pensive;Worried  Affect Anxious;Sad;Sullen  Speech Slow  Interaction Cautious;Forwards little;Guarded;Minimal  Motor Activity Slow  Appearance/Hygiene Unremarkable  Behavior Characteristics Cooperative  Mood Pleasant  Thought Process  Coherency Blocking  Content Paranoia  Delusions Paranoid  Perception Hallucinations  Hallucination Auditory  Judgment Poor  Confusion Moderate  Danger to Self  Current suicidal ideation? Denies  Danger to Others  Danger to Others None reported or observed   Pt visible on the unit, pt appeared less preoccupied this evening. Pt had pleasant affect and appeared to have better cognition.

## 2019-03-21 NOTE — Progress Notes (Signed)
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; 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)  Frequency of Recreation/Participation: Other (Comment)(Daily)  Awareness of Community Resources:  Yes  Community Resources:  [employer], [employer]  Current Use: No  If no, Barriers?: Other (Comment)(Uses the farm for everything)  Expressed Interest in [employer] Information: No  [employer] of Residence:  [place]  Patient Main Form of Transportation: Car  Patient Strengths:  Being a Programmer, multimedia; Being able to handle situations; Avoid accepting things  Patient Identified Areas of Improvement:  Lack of obdience; Lack of exercise  Patient Goal for Hospitalization:  "stay drug free and get back to family/friends"  Current SI (including self-harm):  No  Current HI:  No  Current AVH: No  Staff Intervention Plan: Group Attendance, Collaborate with Interdisciplinary Treatment Team  Consent to Intern Participation: N/A     Caroll Rancher, LRT/CTRS  Caroll Rancher A 03/21/2019, 3:18 PM

## 2019-03-22 DIAGNOSIS — F19959 Other psychoactive substance use, unspecified with psychoactive substance-induced psychotic disorder, unspecified: Secondary | ICD-10-CM | POA: Diagnosis not present

## 2019-03-22 MED ORDER — OMEGA-3-ACID ETHYL ESTERS 1 G PO CAPS: 1.0000 g | ORAL_CAPSULE | Freq: Two times a day (BID) | ORAL | 2 refills | Status: AC

## 2019-03-22 MED ORDER — CARBAMAZEPINE ER 200 MG PO TB12: ORAL_TABLET | ORAL | 2 refills | Status: AC

## 2019-03-22 MED ORDER — GABAPENTIN 300 MG PO CAPS: 300.0000 mg | ORAL_CAPSULE | Freq: Three times a day (TID) | ORAL | 1 refills | Status: AC

## 2019-03-22 MED ORDER — PROPRANOLOL HCL 20 MG PO TABS: 20.0000 mg | ORAL_TABLET | Freq: Two times a day (BID) | ORAL | 2 refills | Status: AC

## 2019-03-22 MED ORDER — RISPERIDONE 3 MG PO TABS: 3.0000 mg | ORAL_TABLET | Freq: Every day | ORAL | 1 refills | Status: AC

## 2019-03-22 MED ORDER — TEMAZEPAM 15 MG PO CAPS
15.0000 mg | ORAL_CAPSULE | Freq: Every day | ORAL | Status: DC
  Administered 2019-03-22: 21:00:00 15 mg via ORAL
  Filled 2019-03-22: qty 1

## 2019-03-22 MED ORDER — BENZTROPINE MESYLATE 0.5 MG PO TABS: 0.5000 mg | ORAL_TABLET | Freq: Two times a day (BID) | ORAL | 1 refills | Status: AC

## 2019-03-22 MED ORDER — GABAPENTIN 300 MG PO CAPS
300.0000 mg | ORAL_CAPSULE | Freq: Three times a day (TID) | ORAL | Status: DC
  Administered 2019-03-22 (×2): 300 mg via ORAL
  Filled 2019-03-22 (×6): qty 1

## 2019-03-22 NOTE — Progress Notes (Signed)
[facility] MD Progress Note  03/22/2019 9:13 AM DORRANCE SELLICK  MRN:  [id number] Subjective:   This is the first psychiatric admission here or elsewhere for Kaidon who presented with new onset psychosis in the context of chronic substance abuse but more recently synthetic cannabis abuse.  Though the patient's accompanying friend at the point of initial evaluation acknowledged the patient had been abusing multiple compounds, the patient himself states he only did "K2".  Drug screen positive for amphetamines (Adderall most likely) and cannabis.   Patient shown improvement he is recalibrated to the point where he reports no auditory or visual hallucinations he is oriented to person place time and situation and denies thoughts of self-harm he understands he is to go to rehab tomorrow morning at [facility].  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 No pertinent past medical history. History reviewed. No pertinent surgical history. Family History: History reviewed. No pertinent family history. Family Psychiatric  History: see eval Social History:  Social History   Substance and Sexual Activity  Alcohol Use Yes   Comment: occ     Social History   Substance and Sexual Activity  Drug Use Yes  . Types: Marijuana    Social History   Socioeconomic History  . Marital status: Single    Spouse name: Not on file  . Number of children: Not on file  . Years of education: Not on file  . Highest education level: Not on file  Occupational History  . Not on file  Tobacco Use  . Smoking status: Current Every Day Smoker    Packs/day: 1.00    Types: Cigarettes  . Smokeless tobacco: Never Used  Substance and  Sexual Activity  . Alcohol use: Yes    Comment: occ  . Drug use: Yes    Types: Marijuana  . Sexual activity: Not Currently  Other Topics Concern  . Not on file  Social History Narrative  . Not on file   Social Determinants of Health   Financial Resource Strain:   . Difficulty of Paying Living Expenses: Not on file  Food Insecurity:   . Worried About Programme researcher, broadcasting/film/video in the Last Year: Not on file  . Ran Out of Food in the Last Year: Not on file  Transportation Needs:   . Lack of Transportation (Medical): Not on file  . Lack of Transportation (Non-Medical): Not on file  Physical Activity:   . Days of Exercise per Week: Not on file  . Minutes of Exercise per Session: Not on file  Stress:   . Feeling of Stress : Not on file  Social Connections:   . Frequency of Communication with Friends and Family: Not on file  . Frequency of Social Gatherings with Friends and Family: Not on file  . Attends Religious Services: Not on file  . Active Member of Clubs or Organizations: Not on file  . Attends Banker Meetings: Not on file  . Marital Status: Not on file   Additional Social History:                         Sleep: Good  Appetite:  Good  Current Medications: Current Facility-Administered Medications  Medication Dose Route Frequency Provider Last Rate  Last Admin  . benztropine (COGENTIN) tablet 0.5 mg  0.5 mg Oral BID Johnn Hai, MD   0.5 mg at 03/22/19 0804  . carbamazepine (TEGRETOL) chewable tablet 200 mg  200 mg Oral TID Johnn Hai, MD   200 mg at 03/22/19 0804  . clonazePAM (KLONOPIN) tablet 0.5 mg  0.5 mg Oral BID Johnn Hai, MD   0.5 mg at 03/22/19 0804  . haloperidol (HALDOL) tablet 5 mg  5 mg Oral Q6H PRN Johnn Hai, MD       Or  . haloperidol lactate (HALDOL) injection 10 mg  10 mg Intramuscular Q6H PRN Johnn Hai, MD      . nicotine (NICODERM CQ - dosed in mg/24 hours) patch 21 mg  21 mg Transdermal Daily Anike, Adaku C, NP   21 mg  at 03/22/19 0806  . omega-3 acid ethyl esters (LOVAZA) capsule 1 g  1 g Oral BID Johnn Hai, MD   1 g at 03/22/19 0804  . prenatal multivitamin tablet 1 tablet  1 tablet Oral Daily Johnn Hai, MD   1 tablet at 03/22/19 0804  . propranolol (INDERAL) tablet 20 mg  20 mg Oral BID Johnn Hai, MD   20 mg at 03/22/19 0804  . risperiDONE (RISPERDAL) tablet 2 mg  2 mg Oral BID Johnn Hai, MD   2 mg at 03/22/19 0804  . temazepam (RESTORIL) capsule 30 mg  30 mg Oral QHS Johnn Hai, MD   30 mg at 03/21/19 2052    Lab Results: No results found for this or any previous visit (from the past 81 hour(s)).  Blood Alcohol level:  Lab Results  Component Value Date   ETH <10 97/98/9211    Metabolic Disorder Labs: No results found for: HGBA1C, MPG No results found for: PROLACTIN Lab Results  Component Value Date   CHOL 145 03/19/2019   TRIG 40 03/19/2019   HDL 31 (L) 03/19/2019   CHOLHDL 4.7 03/19/2019   VLDL 8 03/19/2019   LDLCALC 106 (H) 03/19/2019   LDLCALC 119 (H) 03/19/2019    Physical Findings: AIMS: Facial and Oral Movements Muscles of Facial Expression: None, normal Lips and Perioral Area: None, normal Jaw: None, normal Tongue: None, normal,Extremity Movements Upper (arms, wrists, hands, fingers): None, normal Lower (legs, knees, ankles, toes): None, normal, Trunk Movements Neck, shoulders, hips: None, normal, Overall Severity Severity of abnormal movements (highest score from questions above): None, normal Incapacitation due to abnormal movements: None, normal Patient's awareness of abnormal movements (rate only patient's report): No Awareness, Dental Status Current problems with teeth and/or dentures?: No Does patient usually wear dentures?: No  CIWA:    COWS:     Musculoskeletal: Strength & Muscle Tone: within normal limits Gait & Station: normal Patient leans: N/A  Psychiatric Specialty Exam: Physical Exam  Review of Systems  Blood pressure 107/73, pulse (!)  129, temperature (!) 97.4 F (36.3 C), temperature source Oral, resp. rate 18, height 6\' 3"  (1.905 m), weight 97.5 kg, SpO2 100 %.Body mass index is 26.87 kg/m.  General Appearance: Casual  Eye Contact:  Fair  Speech:  Clear and Coherent  Volume:  Decreased  Mood:  Euthymic  Affect:  Blunt  Thought Process:  Coherent and Goal Directed  Orientation:  Full (Time, Place, and Person)  Thought Content:  Denies hallucinations no delusional material expressed  Suicidal Thoughts:  No  Homicidal Thoughts:  No  Memory:  Immediate;   Fair Recent;   Fair Remote;   Fair  Judgement:  Fair  Insight:  Fair  Psychomotor Activity:  Normal  Concentration:  Concentration: Fair and Attention Span: Fair  Recall:  [employer] of Knowledge:  Fair  Language:  Fair  Akathisia:  Negative  Handed:  Right  AIMS (if indicated):     Assets:  Communication Skills Desire for Improvement  ADL's:  Intact  Cognition:  WNL  Sleep:  Number of Hours: 6.25    Treatment Plan Summary: Daily contact with patient to assess and evaluate symptoms and progress in treatment and Medication management  continue alprazolam detox with carbamazepine, Klonopin taper, and reinstitute Neurontin as patient denies past abuse of this though father had concerns Continue low-dose Risperdal, probable tapering up on discharge No change in precautions probable discharge tomorrow  Malvin Johns, MD 03/22/2019, 9:13 AM

## 2019-03-22 NOTE — BHH Group Notes (Signed)
LCSW Group Therapy Notes  Date and Time: 03/22/2019 @ 1:30pm  Type of Therapy and Topic: Group Therapy: Core Beliefs  Participation Level: BHH PARTICIPATION LEVEL: Active  Description of Group: In this group patients will be encouraged to explore their negative and positive core beliefs about themselves, others, and the world. Each patient will be challenged to identify these beliefs and ways to challenge negative core beliefs. This group will be process-oriented, with patients participating in exploration of their own experiences as well as giving and receiving support and challenge from other group members.  Therapeutic Goals: 1. Patient will identify personal core beliefs, both negative and positive. 2. Patient will identify core beliefs relating to others, both negative and positive. 3. Patient will challenge their negative beliefs about themselves and others. 4. Patient will identify three changes they can make to replace negative core beliefs with positive beliefs.  Summary of Patient Progress  Due to the acuity of the unit, this group has been supplemented with worksheets.  Therapeutic Modalities: Cognitive Behavioral Therapy Solution Focused Therapy Motivational Interviewing     Avrey Flanagin, MSW, LCSW 

## 2019-03-22 NOTE — Progress Notes (Signed)
Pt stated he liked the Restoril it helped him to relax and sleep.

## 2019-03-22 NOTE — Progress Notes (Signed)
Recreation Therapy Notes  Date: 1.28.21 Time: 0945 Location: [address] Dayroom   Group Topic: Communication, Team Building, Problem Solving  Goal Area(s) Addresses:  Patient will effectively work with peer towards shared goal.  Patient will identify skill used to make activity successful.  Patient will identify how skills used during activity can be used to reach post d/c goals.   Behavioral Response: Engaged  Intervention: STEM Activity   Activity: [employer]. Patients were provided the following materials: 5 drinking straws, 5 rubber bands, 5 paper clips, 2 index cards, and 2 drinking cups. Using the provided materials patients were asked to build a launching mechanisms to launch a ping pong ball approximately 12 feet. Patients were divided into teams of 3-5.   Education: Pharmacist, community, Building control surveyor.   Education Outcome: Acknowledges education/In group clarification offered/Needs additional education.   Clinical Observations/Feedback:  Pt was bright and engaged.  Pt worked great with peers.  Pt appeared to listen when peers made suggestions and pt would make suggestions as well.  Pt also expressed communication was important when dealing with support system.  Pt stated communication was the "first step".    Caroll Rancher, LRT/CTRS        Lillia Abed, Shedric Fredericks A 03/22/2019 11:06 AM

## 2019-03-22 NOTE — Progress Notes (Signed)
    03/22/19 1000  Psych Admission Type (Psych Patients Only)  Admission Status Voluntary  Psychosocial Assessment  Patient Complaints Substance abuse  Eye Contact Avertive;Brief  Facial Expression Anxious;Pensive;Worried  Affect Anxious;Sad;Sullen  Speech Slow  Interaction Cautious;Forwards little;Guarded;Minimal  Motor Activity Slow  Appearance/Hygiene Unremarkable  Behavior Characteristics Cooperative  Mood Pleasant  Thought Process  Coherency Blocking  Content Paranoia  Delusions Paranoid  Perception Hallucinations  Hallucination Auditory  Judgment Poor  Confusion Moderate  Danger to Self  Current suicidal ideation? Denies  Danger to Others  Danger to Others None reported or observed

## 2019-03-22 NOTE — Progress Notes (Signed)
Patient ID: Andrew Christensen, male   DOB: [birth year], 24 y.o.   MRN: [id number]    This patient has a residential substance use treatment intake appointment tomorrow morning at 7:45am.  A taxi voucher for this patient will be placed on another patient's chart. THEY WILL RIDE TOGETHER!!!!!!  Patient needs to discharge tomorrow morning no later than 7am.  Night shift staff need to call [employer] company to arrange transportation.

## 2019-03-22 NOTE — Progress Notes (Signed)
  [facility] Adult Case Management Discharge Plan :  Will you be returning to the same living situation after discharge:  No.; Patient will be going to [facility] for 28 days  At discharge, do you have transportation home?: Yes,  Taxi Do you have the ability to pay for your medications: No.; [facility] (samples given)  Release of information consent forms completed and in the chart;  Patient's signature needed at discharge.  Patient to Follow up at: Follow-up Information    [facility]. Go on 03/23/2019.   Why: You are scheduled to go to this provider on 03/23/19 at 7:45 am.  Please bring your discharge summary with you. Contact information: [address] [place] [address] [phone]           Next level of care provider has access to [facility] Link:no  Safety Planning and Suicide Prevention discussed: Yes,  pt's father     Has patient been referred to the Quitline?: Patient refused referral  Patient has been referred for addiction treatment: Yes  Delphia Grates, LCSW 03/22/2019, 3:55 PM

## 2019-03-22 NOTE — BHH Suicide Risk Assessment (Signed)
[facility] Discharge Suicide Risk Assessment   Principal Problem: Karie Mainland substance dependency/abuse/related psychosis/ protracted withdrawal Discharge Diagnoses: Active Problems:   Psychoactive substance-induced psychosis (HCC)   Psychotic disorder (HCC)   Total Time spent with patient: 45 minutes Musculoskeletal: Strength & Muscle Tone: within normal limits Gait & Station: normal Patient leans: N/A  Psychiatric Specialty Exam: Physical Exam  Review of Systems  Blood pressure 107/73, pulse (!) 129, temperature (!) 97.4 F (36.3 C), temperature source Oral, resp. rate 18, height 6\' 3"  (1.905 m), weight 97.5 kg, SpO2 100 %.Body mass index is 26.87 kg/m.  General Appearance: Casual  Eye Contact:  Fair  Speech:  Clear and Coherent  Volume:  Decreased  Mood:  Euthymic  Affect:  Blunt  Thought Process:  Coherent and Goal Directed  Orientation:  Full (Time, Place, and Person)  Thought Content:  Denies hallucinations no delusional material expressed  Suicidal Thoughts:  No  Homicidal Thoughts:  No  Memory:  Immediate;   Fair Recent;   Fair Remote;   Fair  Judgement:  Fair  Insight:  Fair  Psychomotor Activity:  Normal  Concentration:  Concentration: Fair and Attention Span: Fair  Recall:  of Knowledge:  Fair  Language:  Fair  Akathisia:  Negative  Handed:  Right  AIMS (if indicated):     Assets:  Communication Skills Desire for Improvement  ADL's:  Intact  Cognition:  WNL  Sleep:  Number of Hours: 6.25     Mental Status Per Nursing Assessment::   On Admission:  NA  Demographic Factors:  Male and Caucasian  Loss Factors: NA  Historical Factors: NA  Risk Reduction Factors:   Sense of responsibility to family and Religious beliefs about death  Continued Clinical Symptoms:  Alcohol/Substance Abuse/Dependencies  Cognitive Features That Contribute To Risk:  None    Suicide Risk:  Minimal: No identifiable suicidal ideation.  Patients presenting with no  risk factors but with morbid ruminations; may be classified as minimal risk based on the severity of the depressive symptoms  Follow-up Information    [facility]. Go on 03/23/2019.   Why: You are scheduled to go to this provider on 03/23/19 at 7:45 am.  Please bring your discharge summary with you. Contact information: 03/25/19 [place] [phone]           Plan Of Care/Follow-up recommendations:  Activity:  full  Kramer Hanrahan, MD 03/22/2019, 3:48 PM

## 2019-03-22 NOTE — Progress Notes (Signed)
   03/22/19 2100  Psych Admission Type (Psych Patients Only)  Admission Status Voluntary  Psychosocial Assessment  Patient Complaints Substance abuse  Eye Contact Avertive;Brief  Facial Expression Anxious;Pensive;Worried  Affect Anxious;Sad;Sullen  Speech Slow  Interaction Cautious;Forwards little;Guarded;Minimal  Motor Activity Slow  Appearance/Hygiene Unremarkable  Behavior Characteristics Cooperative  Mood Pleasant  Thought Process  Coherency Blocking  Content Paranoia  Delusions Paranoid  Perception Hallucinations  Hallucination Auditory  Judgment Poor  Confusion Moderate  Danger to Self  Current suicidal ideation? Denies  Danger to Others  Danger to Others None reported or observed   Pt stated he was ready for D/C in AM. Pt educated on D/C information with pt verbal understanding.

## 2019-03-23 DIAGNOSIS — F19959 Other psychoactive substance use, unspecified with psychoactive substance-induced psychotic disorder, unspecified: Secondary | ICD-10-CM | POA: Diagnosis not present
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
## 2019-03-23 NOTE — Progress Notes (Signed)
Adult Psychoeducational Group Note  Date:  03/23/2019 Time:  2:45 AM  Group Topic/Focus:  Wrap-Up Group:   The focus of this group is to help patients review their daily goal of treatment and discuss progress on daily workbooks.  Participation Level:  Active  Participation Quality:  Appropriate  Affect:  Appropriate  Cognitive:  Appropriate  Insight: Appropriate  Engagement in Group:  Engaged  Modes of Intervention:  Discussion  Additional Comments:  Pt said his day was a 10. The one positive thing that happen to him today he believe in God. Charna Busman Long 03/23/2019, 2:45 AM

## 2019-03-23 NOTE — Progress Notes (Signed)
Nurs Dischg Note:  D:Patient denies SI/HI at this time. Pt appears calm and cooperative, and no distress noted.  A: All Personal items in locker returned to pt., all  D/C documentation reviewed ,  Pt given tray from the cafeteria ,  Pt escorted to the lobby to wait for cab to [facility].  R:  Pt States he will comply with outpatient services, and take MEDS as prescribed.

## 2019-04-27 ENCOUNTER — Emergency Department (HOSPITAL_COMMUNITY): Payer: Medicaid Other

## 2019-04-27 ENCOUNTER — Encounter (HOSPITAL_COMMUNITY): Payer: Self-pay | Admitting: *Deleted

## 2019-04-27 ENCOUNTER — Other Ambulatory Visit: Payer: Self-pay

## 2019-04-27 ENCOUNTER — Inpatient Hospital Stay (HOSPITAL_COMMUNITY)
Admission: EM | Admit: 2019-04-27 | Discharge: 2019-05-24 | DRG: 917 | Disposition: E | Payer: Medicaid Other | Attending: Critical Care Medicine | Admitting: Critical Care Medicine

## 2019-04-27 DIAGNOSIS — Y92009 Unspecified place in unspecified non-institutional (private) residence as the place of occurrence of the external cause: Secondary | ICD-10-CM | POA: Diagnosis not present

## 2019-04-27 DIAGNOSIS — G935 Compression of brain: Secondary | ICD-10-CM | POA: Diagnosis present

## 2019-04-27 DIAGNOSIS — G931 Anoxic brain damage, not elsewhere classified: Secondary | ICD-10-CM | POA: Diagnosis present

## 2019-04-27 DIAGNOSIS — T50904A Poisoning by unspecified drugs, medicaments and biological substances, undetermined, initial encounter: Secondary | ICD-10-CM | POA: Diagnosis not present

## 2019-04-27 DIAGNOSIS — T17908A Unspecified foreign body in respiratory tract, part unspecified causing other injury, initial encounter: Secondary | ICD-10-CM

## 2019-04-27 DIAGNOSIS — F1110 Opioid abuse, uncomplicated: Secondary | ICD-10-CM | POA: Diagnosis present

## 2019-04-27 DIAGNOSIS — Z20822 Contact with and (suspected) exposure to covid-19: Secondary | ICD-10-CM | POA: Diagnosis present

## 2019-04-27 DIAGNOSIS — F17200 Nicotine dependence, unspecified, uncomplicated: Secondary | ICD-10-CM | POA: Diagnosis present

## 2019-04-27 DIAGNOSIS — E872 Acidosis, unspecified: Secondary | ICD-10-CM

## 2019-04-27 DIAGNOSIS — G9382 Brain death: Secondary | ICD-10-CM | POA: Diagnosis present

## 2019-04-27 DIAGNOSIS — N179 Acute kidney failure, unspecified: Secondary | ICD-10-CM | POA: Diagnosis present

## 2019-04-27 DIAGNOSIS — Z978 Presence of other specified devices: Secondary | ICD-10-CM | POA: Diagnosis not present

## 2019-04-27 DIAGNOSIS — Z4659 Encounter for fitting and adjustment of other gastrointestinal appliance and device: Secondary | ICD-10-CM

## 2019-04-27 DIAGNOSIS — J690 Pneumonitis due to inhalation of food and vomit: Secondary | ICD-10-CM | POA: Diagnosis present

## 2019-04-27 DIAGNOSIS — T43621A Poisoning by amphetamines, accidental (unintentional), initial encounter: Secondary | ICD-10-CM | POA: Diagnosis present

## 2019-04-27 DIAGNOSIS — J9601 Acute respiratory failure with hypoxia: Secondary | ICD-10-CM | POA: Diagnosis present

## 2019-04-27 DIAGNOSIS — I469 Cardiac arrest, cause unspecified: Secondary | ICD-10-CM | POA: Insufficient documentation

## 2019-04-27 DIAGNOSIS — Z529 Donor of unspecified organ or tissue: Secondary | ICD-10-CM

## 2019-04-27 DIAGNOSIS — G40801 Other epilepsy, not intractable, with status epilepticus: Secondary | ICD-10-CM | POA: Diagnosis present

## 2019-04-27 DIAGNOSIS — J9602 Acute respiratory failure with hypercapnia: Secondary | ICD-10-CM | POA: Diagnosis present

## 2019-04-27 DIAGNOSIS — I468 Cardiac arrest due to other underlying condition: Secondary | ICD-10-CM | POA: Diagnosis present

## 2019-04-27 DIAGNOSIS — Z515 Encounter for palliative care: Secondary | ICD-10-CM | POA: Diagnosis not present

## 2019-04-27 DIAGNOSIS — G936 Cerebral edema: Secondary | ICD-10-CM | POA: Diagnosis present

## 2019-04-27 DIAGNOSIS — Z452 Encounter for adjustment and management of vascular access device: Secondary | ICD-10-CM

## 2019-04-27 DIAGNOSIS — Z79899 Other long term (current) drug therapy: Secondary | ICD-10-CM | POA: Diagnosis not present

## 2019-04-27 DIAGNOSIS — T50901A Poisoning by unspecified drugs, medicaments and biological substances, accidental (unintentional), initial encounter: Secondary | ICD-10-CM | POA: Diagnosis present

## 2019-04-27 DIAGNOSIS — F1290 Cannabis use, unspecified, uncomplicated: Secondary | ICD-10-CM | POA: Diagnosis present

## 2019-04-27 DIAGNOSIS — R739 Hyperglycemia, unspecified: Secondary | ICD-10-CM | POA: Diagnosis present

## 2019-04-27 DIAGNOSIS — F1510 Other stimulant abuse, uncomplicated: Secondary | ICD-10-CM | POA: Diagnosis present

## 2019-04-27 DIAGNOSIS — I959 Hypotension, unspecified: Secondary | ICD-10-CM | POA: Diagnosis not present

## 2019-04-27 DIAGNOSIS — K7200 Acute and subacute hepatic failure without coma: Secondary | ICD-10-CM | POA: Diagnosis present

## 2019-04-27 DIAGNOSIS — Z66 Do not resuscitate: Secondary | ICD-10-CM | POA: Diagnosis not present

## 2019-04-27 DIAGNOSIS — I10 Essential (primary) hypertension: Secondary | ICD-10-CM | POA: Diagnosis present

## 2019-04-27 DIAGNOSIS — T401X1A Poisoning by heroin, accidental (unintentional), initial encounter: Secondary | ICD-10-CM | POA: Diagnosis present

## 2019-04-27 LAB — CBC WITH DIFFERENTIAL/PLATELET
Abs Immature Granulocytes: 0.29 10*3/uL — ABNORMAL HIGH (ref 0.00–0.07)
Basophils Absolute: 0 10*3/uL (ref 0.0–0.1)
Basophils Relative: 0 %
Eosinophils Absolute: 0.1 10*3/uL (ref 0.0–0.5)
Eosinophils Relative: 1 %
HCT: 44.9 % (ref 39.0–52.0)
Hemoglobin: 14.7 g/dL (ref 13.0–17.0)
Immature Granulocytes: 2 %
Lymphocytes Relative: 37 %
Lymphs Abs: 5 10*3/uL — ABNORMAL HIGH (ref 0.7–4.0)
MCH: 32.9 pg (ref 26.0–34.0)
MCHC: 32.7 g/dL (ref 30.0–36.0)
MCV: 100.4 fL — ABNORMAL HIGH (ref 80.0–100.0)
Monocytes Absolute: 0.9 10*3/uL (ref 0.1–1.0)
Monocytes Relative: 6 %
Neutro Abs: 7.4 10*3/uL (ref 1.7–7.7)
Neutrophils Relative %: 54 %
Platelets: 319 10*3/uL (ref 150–400)
RBC: 4.47 MIL/uL (ref 4.22–5.81)
RDW: 13 % (ref 11.5–15.5)
WBC: 13.7 10*3/uL — ABNORMAL HIGH (ref 4.0–10.5)
nRBC: 0 % (ref 0.0–0.2)

## 2019-04-27 LAB — POCT I-STAT 7, (LYTES, BLD GAS, ICA,H+H)
Acid-base deficit: 8 mmol/L — ABNORMAL HIGH (ref 0.0–2.0)
Bicarbonate: 18.4 mmol/L — ABNORMAL LOW (ref 20.0–28.0)
Calcium, Ion: 1.15 mmol/L (ref 1.15–1.40)
HCT: 44 % (ref 39.0–52.0)
Hemoglobin: 15 g/dL (ref 13.0–17.0)
O2 Saturation: 99 %
Patient temperature: 96.6
Potassium: 4.2 mmol/L (ref 3.5–5.1)
Sodium: 133 mmol/L — ABNORMAL LOW (ref 135–145)
TCO2: 19 mmol/L — ABNORMAL LOW (ref 22–32)
pCO2 arterial: 36.4 mmHg (ref 32.0–48.0)
pH, Arterial: 7.305 — ABNORMAL LOW (ref 7.350–7.450)
pO2, Arterial: 155 mmHg — ABNORMAL HIGH (ref 83.0–108.0)

## 2019-04-27 LAB — COMPREHENSIVE METABOLIC PANEL
ALT: 134 U/L — ABNORMAL HIGH (ref 0–44)
AST: 122 U/L — ABNORMAL HIGH (ref 15–41)
Albumin: 3.7 g/dL (ref 3.5–5.0)
Alkaline Phosphatase: 87 U/L (ref 38–126)
Anion gap: 15 (ref 5–15)
BUN: 17 mg/dL (ref 6–20)
CO2: 19 mmol/L — ABNORMAL LOW (ref 22–32)
Calcium: 8.6 mg/dL — ABNORMAL LOW (ref 8.9–10.3)
Chloride: 99 mmol/L (ref 98–111)
Creatinine, Ser: 1.5 mg/dL — ABNORMAL HIGH (ref 0.61–1.24)
GFR calc Af Amer: >60 mL/min (ref >60)
GFR calc non Af Amer: >60 mL/min (ref >60)
Glucose, Bld: 382 mg/dL — ABNORMAL HIGH (ref 70–99)
Potassium: 4.5 mmol/L (ref 3.5–5.1)
Sodium: 133 mmol/L — ABNORMAL LOW (ref 135–145)
Total Bilirubin: 0.5 mg/dL (ref 0.3–1.2)
Total Protein: 5.9 g/dL — ABNORMAL LOW (ref 6.5–8.1)

## 2019-04-27 LAB — URINALYSIS, ROUTINE W REFLEX MICROSCOPIC
Bilirubin Urine: NEGATIVE
Glucose, UA: >=500 mg/dL — AB
Hgb urine dipstick: NEGATIVE
Ketones, ur: NEGATIVE mg/dL
Leukocytes,Ua: NEGATIVE
Nitrite: NEGATIVE
Protein, ur: 100 mg/dL — AB
Specific Gravity, Urine: 1.016 (ref 1.005–1.030)
pH: 6 (ref 5.0–8.0)

## 2019-04-27 LAB — ETHANOL: Alcohol, Ethyl (B): <10 mg/dL (ref <10)

## 2019-04-27 LAB — RAPID URINE DRUG SCREEN, HOSP PERFORMED
Amphetamines: POSITIVE — AB
Barbiturates: NOT DETECTED
Benzodiazepines: NOT DETECTED
Cocaine: NOT DETECTED
Opiates: POSITIVE — AB
Tetrahydrocannabinol: NOT DETECTED

## 2019-04-27 LAB — SALICYLATE LEVEL: Salicylate Lvl: <7 mg/dL — ABNORMAL LOW (ref 7.0–30.0)

## 2019-04-27 LAB — LACTIC ACID, PLASMA
Lactic Acid, Venous: 8.3 mmol/L (ref 0.5–1.9)
Lactic Acid, Venous: 3.9 mmol/L (ref 0.5–1.9)

## 2019-04-27 LAB — RESPIRATORY PANEL BY RT PCR (FLU A&B, COVID)
Influenza A by PCR: NEGATIVE
Influenza B by PCR: NEGATIVE
SARS Coronavirus 2 by RT PCR: NEGATIVE

## 2019-04-27 LAB — PROTIME-INR
INR: 1.1 (ref 0.8–1.2)
Prothrombin Time: 13.8 seconds (ref 11.4–15.2)

## 2019-04-27 LAB — HIV ANTIBODY (ROUTINE TESTING W REFLEX): HIV Screen 4th Generation wRfx: NONREACTIVE

## 2019-04-27 LAB — TSH: TSH: 5.69 u[IU]/mL — ABNORMAL HIGH (ref 0.350–4.500)

## 2019-04-27 LAB — ACETAMINOPHEN LEVEL: Acetaminophen (Tylenol), Serum: <10 ug/mL — ABNORMAL LOW (ref 10–30)

## 2019-04-27 LAB — CBG MONITORING, ED: Glucose-Capillary: 334 mg/dL — ABNORMAL HIGH (ref 70–99)

## 2019-04-27 LAB — AMMONIA: Ammonia: 75 umol/L — ABNORMAL HIGH (ref 9–35)

## 2019-04-27 MED ORDER — FENTANYL CITRATE (PF) 100 MCG/2ML IJ SOLN
100.0000 ug | INTRAMUSCULAR | Status: AC | PRN
  Administered 2019-04-27: 100 ug via INTRAVENOUS
  Administered 2019-04-27 (×2): 50 ug via INTRAVENOUS

## 2019-04-27 MED ORDER — ENOXAPARIN SODIUM 40 MG/0.4ML ~~LOC~~ SOLN
40.0000 mg | SUBCUTANEOUS | Status: DC
  Administered 2019-04-27 – 2019-04-29 (×3): 40 mg via SUBCUTANEOUS
  Filled 2019-04-27 (×4): qty 0.4

## 2019-04-27 MED ORDER — PROPOFOL 1000 MG/100ML IV EMUL
5.0000 ug/kg/min | INTRAVENOUS | Status: DC
  Administered 2019-04-27: 10 ug/kg/min via INTRAVENOUS
  Administered 2019-04-28: 80 ug/kg/min via INTRAVENOUS
  Administered 2019-04-28: 70 ug/kg/min via INTRAVENOUS
  Administered 2019-04-28: 40 ug/kg/min via INTRAVENOUS
  Administered 2019-04-28: 20 ug/kg/min via INTRAVENOUS
  Administered 2019-04-28 – 2019-04-29 (×6): 70 ug/kg/min via INTRAVENOUS
  Administered 2019-04-30: 70.085 ug/kg/min via INTRAVENOUS
  Administered 2019-04-30 (×2): 70 ug/kg/min via INTRAVENOUS
  Filled 2019-04-27 (×9): qty 100
  Filled 2019-04-27: qty 400
  Filled 2019-04-27 (×5): qty 100
  Filled 2019-04-27: qty 200
  Filled 2019-04-27: qty 100

## 2019-04-27 MED ORDER — PIPERACILLIN-TAZOBACTAM 3.375 G IVPB
3.3750 g | Freq: Three times a day (TID) | INTRAVENOUS | Status: DC
  Administered 2019-04-28 – 2019-04-30 (×7): 3.375 g via INTRAVENOUS
  Filled 2019-04-27 (×7): qty 50

## 2019-04-27 MED ORDER — FENTANYL CITRATE (PF) 100 MCG/2ML IJ SOLN
INTRAMUSCULAR | Status: AC
  Filled 2019-04-27: qty 2

## 2019-04-27 MED ORDER — LACTATED RINGERS IV SOLN: INTRAVENOUS | Status: DC

## 2019-04-27 MED ORDER — MIDAZOLAM HCL 2 MG/2ML IJ SOLN
2.0000 mg | INTRAMUSCULAR | Status: DC | PRN
  Administered 2019-04-27: 2 mg via INTRAVENOUS

## 2019-04-27 MED ORDER — FENTANYL CITRATE (PF) 100 MCG/2ML IJ SOLN: 100.0000 ug | INTRAMUSCULAR | Status: DC | PRN

## 2019-04-27 MED ORDER — FAMOTIDINE IN NACL 20-0.9 MG/50ML-% IV SOLN
20.0000 mg | Freq: Two times a day (BID) | INTRAVENOUS | Status: DC
  Administered 2019-04-27 – 2019-04-30 (×6): 20 mg via INTRAVENOUS
  Filled 2019-04-27 (×6): qty 50

## 2019-04-27 MED ORDER — FENTANYL CITRATE (PF) 100 MCG/2ML IJ SOLN
INTRAMUSCULAR | Status: AC
  Administered 2019-04-27: 50 ug
  Filled 2019-04-27: qty 2

## 2019-04-27 MED ORDER — MIDAZOLAM HCL 2 MG/2ML IJ SOLN: 2.0000 mg | INTRAMUSCULAR | Status: DC | PRN

## 2019-04-27 MED ORDER — PIPERACILLIN-TAZOBACTAM 3.375 G IVPB 30 MIN
3.3750 g | INTRAVENOUS | Status: AC
  Administered 2019-04-27: 3.375 g via INTRAVENOUS
  Filled 2019-04-27: qty 50

## 2019-04-27 MED ORDER — SODIUM CHLORIDE 0.9 % IV BOLUS
1000.0000 mL | Freq: Once | INTRAVENOUS | Status: AC
  Administered 2019-04-27: 1000 mL via INTRAVENOUS

## 2019-04-27 MED ORDER — MIDAZOLAM HCL 2 MG/2ML IJ SOLN
INTRAMUSCULAR | Status: AC
  Filled 2019-04-27: qty 2

## 2019-04-27 NOTE — ED Notes (Signed)
Critical Care PA Provider at bedside.

## 2019-04-27 NOTE — H&P (Addendum)
NAME:  Andrew Christensen, MRN:  [id number], DOB:  [birth year], LOS: 0 ADMISSION DATE:  05/21/2019, CONSULTATION DATE:  05/07/2019 REFERRING MD: Durene Cal  , CHIEF COMPLAINT:  Cardiac Arrest   Brief History   24 year old male with a history of polysubstance abuse who was found unresponsive at home, status post 8 minutes of CPR by EMS with ROSC.  Intubated and PCCM asked to admit  History of present illness   24 year old male with a past medical history of polysubstance abuse who is coming from home after being found unresponsive.  His father is at the bedside and states that they work together all day, he seemed happy and to be doing well, he dropped him off at home around 5:55 PM and went back out again.  He is unsure of the timing, but thinks he came back home about 40 minutes later and found his son naked and curled up in the corner of the bathroom unresponsive and not breathing; states he was blue around the mouth.  His father immediately called 911 and started CPR, on EMS arrival patient was in asystole.  CPR for approximately 8 minutes with 1 epi given and then ROSC with sinus tach.  Patient was intubated and transported to the emergency department.  CT head shows diffuse loss of sulcal markings, no definite edema.  CXR clear, but CT cervical spine with confluent bilateral pulmonary opacities in the visualized lung apices concerning for multifocal pneumonia.  UDS positive for heroine and amphetamines.  Lactic acid up-trending from 3.9 to 8.3.  He was given fentanyl and Versed in the ED, has had some myoclonus, and breathing over the vent, but otherwise not responsive.  He is prescribed Adderall, but his father has been concerned that he might return to snorting heroin as he has in the past   Patient with was admitted in January 2021 for psychosis and polysubstance abuse (cannabis, K2, Adderall) and was discharged home at that time.  His father notes no recent suicidal ideation or obvious depression though  previous psych note states: "His father reports that his mother was addicted to numerous opiates in her lifetime and actually died 3 years ago from complications from opiate dependency but states that she actually gave [employer] pills in the sixth grade, he was actually taking and selling Percocet at that age."   Past Medical History  Polysubstance abuse, ADD  Significant Hospital Events   3/5 admit to PCCM  Consults:    Procedures:  3/5 ETT  Significant Diagnostic Tests:  3/5 CT head>>Mild diffuse loss of sulcal markings when compared to the prior study dated March 18, 2019, without definite evidence of diffuse cerebral edema. MRI correlation is recommended. 3/5 CT C-spine>>Confluent bilateral pulmonary opacities concerning for multifocal pneumonia. Clinical correlation is recommended.  Micro Data:  3/5 SARS-Cov-2>> negative 3/5 blood cultures x2>>  Antimicrobials:  Zosyn 3/5-  Interim history/subjective:  Patient's father updated at the bedside, hemodynamically stable after intubation  Objective   Blood pressure (!) 151/87, pulse 85, temperature (!) 97.5 F (36.4 C), resp. rate 17, weight 97.5 kg, SpO2 99 %.    Vent Mode: PRVC FiO2 (%):  [40 %-60 %] 40 % Set Rate:  [18 bmp] 18 bmp Vt Set:  [650 mL] 650 mL PEEP:  [5 cmH20] 5 cmH20 Plateau Pressure:  [16 cmH20] 16 cmH20   Intake/Output Summary (Last 24 hours) at 05/05/2019 2257 Last data filed at 05/09/2019 2209 Gross per 24 hour  Intake 1001.53 ml  Output 175 ml  Net 826.53 ml   Filed Weights   04/24/2019 2025  Weight: 97.5 kg    General: Well-nourished male intubated and sedated HEENT: MM pink/moist Neuro: Unresponsive on propofol, breathing over the vent, positive corneal reflexes pupils 1 mm and unresponsive bilaterally, occasional myoclonic jerking, no sustained seizure-like activity CV: s1s2 RRR, no m/r/g PULM: ET tube in place, CTAB GI: soft, bsx4 active  Extremities: warm/dry, no edema  Skin: no rashes or  lesions   Resolved Hospital Problem list   none  Assessment & Plan:   PEA arrest s/p ROSC with subsequent hypoxic respiratory failure and intubation -not a candidate for cooling due to unknown down time -P: -Continue full ventilatory support -Propofol for agitation -Obtain EEG -CT scan with diffuse loss of sulcal markings, concern for cerebral edema secondary to hypoxic brain injury, will obtain stat MRI, possible neuro consult, mannitol, hyperventilation --Maintain full vent support with SAT/SBT as tolerated -titrate Vent setting to maintain SpO2 greater than or equal to 90%. -HOB elevated 30 degrees. -Plateau pressures less than 30 cm H20.  -Follow chest x-ray, ABG prn.   -Bronchial hygiene and RT/bronchodilator protocol. -Guarded prognosis given unknown downtime, this was discussed with patient's father at the bedside, supportive care for at least 48 hours before neuroprognostication  Likely aspiration pneumonia -Bilateral upper lobe infiltrates on CT -Covid negative P: -Obtain blood cultures and start Zosyn     NAGMA, elevated creatinine -CO2 19, pH 7.305 -urine with glucose and protein, but no ketones -raises suspicion of RTA P: -check urine sodium, potassium, glucose, urea, osmolarity -check Mag, phos, cortisol -check ABG overnight, if acidosis worsening may need bicarb   Hyperglycemia -no history of DM P: -check A1c and treat with SSI   Elevated Transaminases -likely early shock liver P: -repeat CMP in the AM   Polysubstance abuse -Most suspicious of heroin overdose P: -If patient recovers will need psychiatric support      Best practice:  Diet: N.p.o. Pain/Anxiety/Delirium protocol (if indicated): Propofol VAP protocol (if indicated): Head of the bed 30 degrees, daily SBT DVT prophylaxis: Lovenox GI prophylaxis: Pepcid Glucose control: SSI Mobility: Bedrest Code Status: Full code Family Communication: Father updated at  bedside Disposition: ICU  Labs   CBC: Recent Labs  Lab 05/07/2019 1925 04/29/2019 1953  WBC 13.7*  --   NEUTROABS 7.4  --   HGB 14.7 15.0  HCT 44.9 44.0  MCV 100.4*  --   PLT 319  --     Basic Metabolic Panel: Recent Labs  Lab 05/20/2019 1925 04/26/2019 1953  NA 133* 133*  K 4.5 4.2  CL 99  --   CO2 19*  --   GLUCOSE 382*  --   BUN 17  --   CREATININE 1.50*  --   CALCIUM 8.6*  --    GFR: CrCl cannot be calculated (Unknown ideal weight.). Recent Labs  Lab 05/06/2019 1925 05/03/2019 2125  WBC 13.7*  --   LATICACIDVEN 8.3* 3.9*    Liver Function Tests: Recent Labs  Lab 05/22/2019 1925  AST 122*  ALT 134*  ALKPHOS 87  BILITOT 0.5  PROT 5.9*  ALBUMIN 3.7   No results for input(s): LIPASE, AMYLASE in the last 168 hours. Recent Labs  Lab 05/10/2019 1939  AMMONIA 75*    ABG    Component Value Date/Time   PHART 7.305 (L) 05/06/2019 1953   PCO2ART 36.4 05/22/2019 1953   PO2ART 155.0 (H) 05/11/2019 1953   HCO3 18.4 (L) 05/13/2019 1953   TCO2 19 (  L) 2019-05-23 1953   ACIDBASEDEF 8.0 (H) 05-23-19 1953   O2SAT 99.0 05-23-2019 1953     Coagulation Profile: Recent Labs  Lab May 23, 2019 1925  INR 1.1    Cardiac Enzymes: No results for input(s): CKTOTAL, CKMB, CKMBINDEX, TROPONINI in the last 168 hours.  HbA1C: No results found for: HGBA1C  CBG: Recent Labs  Lab 23-May-2019 1925  GLUCAP 334*    Review of Systems:   Unable to obtain secondary to mental status  Past Medical History  He,  has no past medical history on file.   Surgical History   History reviewed. No pertinent surgical history.   Social History   reports that he has been smoking. He does not have any smokeless tobacco history on file. He reports current alcohol use. He reports current drug use. Drug: Marijuana.   Family History   His family history is not on file.   Allergies Not on File   Home Medications  Prior to Admission medications   Medication Sig Start Date End Date Taking?  Authorizing Provider  benztropine (COGENTIN) 0.5 MG tablet Take 0.5 mg by mouth 2 (two) times daily.    [provider]  carbamazepine (TEGRETOL-XR) 200 MG 12 hr tablet Take 200 mg by mouth See admin instructions. Take 200 mg by mouth in the morning and 400 mg at bedtime    [provider]  gabapentin (NEURONTIN) 300 MG capsule Take 300 mg by mouth 3 (three) times daily.    [provider]  omega-3 acid ethyl esters (LOVAZA) 1 g capsule Take 1 g by mouth 2 (two) times daily.    [provider]  propranolol (INDERAL) 20 MG tablet Take 20 mg by mouth 2 (two) times daily.    [provider]  risperiDONE (RISPERDAL) 3 MG tablet Take 3 mg by mouth at bedtime.    [provider]  sildenafil (REVATIO) 20 MG tablet Take 60 mg by mouth See admin instructions. Take 20 mg by mouth daily as needed 1 hour before sex (max 3 tablets/day) 03/12/19   [provider]     Critical care time: 55 minutes    CRITICAL CARE Performed by: Otilio Carpen Preesha Benjamin   Total critical care time: 55 minutes  Critical care time was exclusive of separately billable procedures and treating other patients.  Critical care was necessary to treat or prevent imminent or life-threatening deterioration.  Critical care was time spent personally by me on the following activities: development of treatment plan with patient and/or surrogate as well as nursing, discussions with consultants, evaluation of patient's response to treatment, examination of patient, obtaining history from patient or surrogate, ordering and performing treatments and interventions, ordering and review of laboratory studies, ordering and review of radiographic studies, pulse oximetry and re-evaluation of patient's condition.   Otilio Carpen Ollis Daudelin, PA-C [facility]  Pager# [phone], if no answer [phone]

## 2019-04-27 NOTE — Progress Notes (Signed)
Pharmacy Antibiotic Note  Andrew Christensen is a 24 y.o. male admitted on 2019/05/19 with asp pna.  Pharmacy has been consulted for Zosyn dosing.  Plan: Zosyn 3.375gm IV q8h Will f/u renal function, micro data, and pt's clinical condition  Weight: 215 lb (97.5 kg)  Temp (24hrs), Avg:95.3 F (35.2 C), Min:94.7 F (34.8 C), Max:97.5 F (36.4 C)  Recent Labs  Lab 05-19-2019 1925 19-May-2019 2125  WBC 13.7*  --   CREATININE 1.50*  --   LATICACIDVEN 8.3* 3.9*    CrCl cannot be calculated (Unknown ideal weight.).    Not on File  Antimicrobials this admission: 3/5 Zosyn >>   Microbiology results: 3/5 BCx:   Thank you for allowing pharmacy to be a part of this patient's care.  Christoper Fabian, PharmD, BCPS Please see amion for complete clinical pharmacist phone list May 19, 2019 11:06 PM

## 2019-04-27 NOTE — ED Notes (Signed)
Returned from ct, [employer] at bedside.

## 2019-04-27 NOTE — ED Triage Notes (Signed)
Pt arrives from home via [facility]. approx 24 year old  found unresponsive. Last seen normal approx 1830 tonight. On EMS arrival, pt was in asystole, cpr for approx 8 minutes. 1 epi given, PEA. Next pulse check pt was + pulse, ST on the monitor. Pressure 150/100. IV established in the left [facility], I/O established. King airway in place. Pt was recently in rehab for unknown substance abuse.

## 2019-04-27 NOTE — ED Provider Notes (Signed)
[facility] EMERGENCY DEPARTMENT Provider Note   CSN: [id number] Arrival date & time: 05/01/2019  1923     History CC: Cardiac Arrest  Andrew Christensen is a 24 y.o. male.  The history is provided by the EMS personnel and a parent. The history is limited by the condition of the patient.  Cardiac Arrest Witnessed by:  Not witnessed Incident location:  Home Time before BLS initiated: unknown. Condition upon EMS arrival:  Apneic and unresponsive Pulse:  Absent Initial cardiac rhythm per EMS:  Asystole (after initiation of ACLS became PEA, then obtained ROSC with Sinus tachycardia) Treatments prior to arrival:  ACLS protocol Medications given prior to ED:  Epinephrine Airway: king airway. Rhythm on admission to ED:  Sinus tachycardia Risk factors comment:  Substance abuse      History reviewed. No pertinent past medical history.  Patient Active Problem List   Diagnosis Date Noted  . Drug overdose 05/20/2019  . Cardiac arrest [facility])     History reviewed. No pertinent surgical history.     No family history on file.  Social History   Tobacco Use  . Smoking status: Current Every Day Smoker  Substance Use Topics  . Alcohol use: Yes  . Drug use: Yes    Types: Marijuana    Home Medications Prior to Admission medications   Medication Sig Start Date End Date Taking? Authorizing Provider  benztropine (COGENTIN) 0.5 MG tablet Take 0.5 mg by mouth 2 (two) times daily.    [provider]  carbamazepine (TEGRETOL-XR) 200 MG 12 hr tablet Take 200 mg by mouth See admin instructions. Take 200 mg by mouth in the morning and 400 mg at bedtime    [provider]  gabapentin (NEURONTIN) 300 MG capsule Take 300 mg by mouth 3 (three) times daily.    [provider]  omega-3 acid ethyl esters (LOVAZA) 1 g capsule Take 1 g by mouth 2 (two) times daily.    [provider]  propranolol (INDERAL) 20 MG tablet Take 20 mg by mouth 2 (two)  times daily.    [provider]  risperiDONE (RISPERDAL) 3 MG tablet Take 3 mg by mouth at bedtime.    [provider]  sildenafil (REVATIO) 20 MG tablet Take 60 mg by mouth See admin instructions. Take 20 mg by mouth daily as needed 1 hour before sex (max 3 tablets/day) 03/12/19   [provider]    Allergies    Patient has no allergy information on record.  Review of Systems   Review of Systems  Unable to perform ROS: Patient unresponsive    Physical Exam Updated Vital Signs BP (!) 141/89   Pulse 85   Temp 98 F (36.7 C)   Resp 18   Wt 97.5 kg   SpO2 98%   Physical Exam Vitals and nursing note reviewed.  Constitutional:      Appearance: He is well-developed. He is toxic-appearing.     Comments: King airway in place  HENT:     Head: Normocephalic and atraumatic.     Right Ear: Tympanic membrane normal.     Left Ear: Tympanic membrane normal.     Mouth/Throat:     Mouth: Mucous membranes are moist.     Pharynx: Oropharynx is clear.  Eyes:     Conjunctiva/sclera: Conjunctivae normal.     Comments: Pupils 78mm and unreactive  Neck:     Comments: No JVD Cardiovascular:     Rate and Rhythm:  Regular rhythm. Tachycardia present.     Pulses: Normal pulses.     Heart sounds: No murmur.  Pulmonary:     Comments: Bilateral breath sounds, Assisted respirations Abdominal:     General: There is no distension.     Palpations: Abdomen is soft.  Musculoskeletal:     Cervical back: Neck supple.     Right lower leg: No edema.     Left lower leg: No edema.  Skin:    General: Skin is warm and dry.  Neurological:     Mental Status: He is unresponsive.     GCS: GCS eye subscore is 1. GCS verbal subscore is 1. GCS motor subscore is 1.     Comments: Pupils 48mm and unreactive bilat     ED Results / Procedures / Treatments   Labs (all labs ordered are listed, but only abnormal results are displayed) Labs Reviewed  COMPREHENSIVE METABOLIC PANEL -  Abnormal; Notable for the following components:      Result Value   Sodium 133 (*)    CO2 19 (*)    Glucose, Bld 382 (*)    Creatinine, Ser 1.50 (*)    Calcium 8.6 (*)    Total Protein 5.9 (*)    AST 122 (*)    ALT 134 (*)    All other components within normal limits  LACTIC ACID, PLASMA - Abnormal; Notable for the following components:   Lactic Acid, Venous 8.3 (*)    All other components within normal limits  LACTIC ACID, PLASMA - Abnormal; Notable for the following components:   Lactic Acid, Venous 3.9 (*)    All other components within normal limits  CBC WITH DIFFERENTIAL/PLATELET - Abnormal; Notable for the following components:   WBC 13.7 (*)    MCV 100.4 (*)    Lymphs Abs 5.0 (*)    Abs Immature Granulocytes 0.29 (*)    All other components within normal limits  URINALYSIS, ROUTINE W REFLEX MICROSCOPIC - Abnormal; Notable for the following components:   APPearance CLOUDY (*)    Glucose, UA >=500 (*)    Protein, ur 100 (*)    Bacteria, UA FEW (*)    All other components within normal limits  SALICYLATE LEVEL - Abnormal; Notable for the following components:   Salicylate Lvl <9.7 (*)    All other components within normal limits  ACETAMINOPHEN LEVEL - Abnormal; Notable for the following components:   Acetaminophen (Tylenol), Serum <10 (*)    All other components within normal limits  RAPID URINE DRUG SCREEN, HOSP PERFORMED - Abnormal; Notable for the following components:   Opiates POSITIVE (*)    Amphetamines POSITIVE (*)    All other components within normal limits  AMMONIA - Abnormal; Notable for the following components:   Ammonia 75 (*)    All other components within normal limits  TSH - Abnormal; Notable for the following components:   TSH 5.690 (*)    All other components within normal limits  CBG MONITORING, ED - Abnormal; Notable for the following components:   Glucose-Capillary 334 (*)    All other components within normal limits  POCT I-STAT 7, (LYTES,  BLD GAS, ICA,H+H) - Abnormal; Notable for the following components:   pH, Arterial 7.305 (*)    pO2, Arterial 155.0 (*)    Bicarbonate 18.4 (*)    TCO2 19 (*)    Acid-base deficit 8.0 (*)    Sodium 133 (*)    All other components within normal limits  RESPIRATORY PANEL BY RT PCR (FLU A&B, COVID)  CULTURE, BLOOD (ROUTINE X 2)  CULTURE, BLOOD (ROUTINE X 2)  PROTIME-INR  ETHANOL  HIV ANTIBODY (ROUTINE TESTING W REFLEX)  TRIGLYCERIDES  CBC  COMPREHENSIVE METABOLIC PANEL  BLOOD GAS, ARTERIAL  MAGNESIUM  PHOSPHORUS  HEMOGLOBIN A1C  CORTISOL  OSMOLALITY, URINE  UREA NITROGEN, URINE  NA AND K (SODIUM & POTASSIUM), RAND UR  I-STAT ARTERIAL BLOOD GAS, ED    EKG EKG Interpretation  Date/Time:  Friday April 27 2019 19:23:35 EST Ventricular Rate:  122 PR Interval:    QRS Duration: 96 QT Interval:  317 QTC Calculation: 452 R Axis:   85 Text Interpretation: Sinus tachycardia Borderline right axis deviation ST depression, probably rate related No acute changes Nonspecific T wave abnormality Confirmed by Derwood Kaplan [phone]) on 04/29/2019 10:00:14 PM   Radiology CT Head Wo Contrast  Result Date: 05/17/2019 CLINICAL DATA:  Altered mental status. EXAM: CT HEAD WITHOUT CONTRAST TECHNIQUE: Contiguous axial images were obtained from the base of the skull through the vertex without intravenous contrast. COMPARISON:  March 18, 2019 FINDINGS: Brain: No evidence of acute infarction, hemorrhage, hydrocephalus, extra-axial collection or mass lesion/mass effect. Mild diffuse loss of sulcal markings is noted when compared to the prior study. Vascular: No hyperdense vessel or unexpected calcification. Skull: Normal. Negative for fracture or focal lesion. Sinuses/Orbits: An 8 mm x 4 mm polyp versus mucous retention cyst is seen within the posteromedial aspect of the right maxillary sinus. Other: None. IMPRESSION: 1. Mild diffuse loss of sulcal markings when compared to the prior study dated March 18, 2019, without definite evidence of diffuse cerebral edema. MRI correlation is recommended. Electronically Signed   By: Aram Candela M.D.   On: 05/18/2019 20:30   CT Cervical Spine Wo Contrast  Result Date: 05/16/2019 CLINICAL DATA:  24 year old male with altered mental status. EXAM: CT CERVICAL SPINE WITHOUT CONTRAST TECHNIQUE: Multidetector CT imaging of the cervical spine was performed without intravenous contrast. Multiplanar CT image reconstructions were also generated. COMPARISON:  None. FINDINGS: Evaluation of this exam is limited due to motion artifact. Alignment: No acute subluxation. Skull base and vertebrae: No definite acute fracture. Soft tissues and spinal canal: No prevertebral fluid or swelling. No visible canal hematoma. Disc levels: No acute findings. No significant degenerative changes. Upper chest: Confluent bilateral pulmonary opacities in the visualized lung apices concerning for multifocal pneumonia. Clinical correlation is recommended. Other: An endotracheal and an enteric tube are partially visualized. IMPRESSION: 1. No definite acute/traumatic cervical spine pathology. 2. Confluent bilateral pulmonary opacities concerning for multifocal pneumonia. Clinical correlation is recommended. Electronically Signed   By: Elgie Collard M.D.   On: 05/07/2019 20:30   DG Chest Portable 1 View  Result Date: 04/29/2019 CLINICAL DATA:  Status post intubation and nasogastric tube placement. EXAM: PORTABLE CHEST 1 VIEW COMPARISON:  March 18, 2019 FINDINGS: Since the prior study there is been placement of an endotracheal tube. Its distal tip is approximately 5.3 cm from the carina. Interval nasogastric tube placement is seen with its distal tip overlying the expected region of the gastric fundus. There is no evidence of acute infiltrate, pleural effusion or pneumothorax. The heart size and mediastinal contours are within normal limits. The visualized skeletal structures are unremarkable.  IMPRESSION: 1. Interval endotracheal tube and nasogastric tube placement and positioning, as described above, when compared to the prior study dated March 18, 2019. 2. No acute or active cardiopulmonary disease. Electronically Signed   By: Demetrius Revel.D.  On: 05/18/2019 19:44    Procedures Procedure Name: Intubation Date/Time: 05/15/2019 7:41 PM Performed by: Aldahir Litaker, [place], MD Pre-anesthesia Checklist: Patient identified, Patient being monitored, Emergency Drugs available, Timeout performed and Suction available Oxygen Delivery Method: Ambu bag Preoxygenation: Pre-oxygenation with 100% oxygen (King airway) Laryngoscope Size: Glidescope and 3 Grade View: Grade I Tube size: 7.5 mm Number of attempts: 1 Airway Equipment and Method: Video-laryngoscopy and Rigid stylet Placement Confirmation: ETT inserted through vocal cords under direct vision,  Breath sounds checked- equal and bilateral,  Positive ETCO2 and CO2 detector Secured at: 23 cm Tube secured with: ETT holder Comments: Emergent intubation of unresponsive patient, done without RSI drugs without complications.       (including critical care time)  Medications Ordered in ED fentaNYL (SUBLIMAZE) 100 MCG/2ML injection (50 mcg  Given by Other 04/25/2019 1930)  fentaNYL (SUBLIMAZE) injection 100 mcg (100 mcg Intravenous Given 05/17/2019 2145)  sodium chloride 0.9 % bolus 1,000 mL (0 mLs Intravenous Stopped 05/23/2019 2043)    ED Course  I have reviewed the triage vital signs and the nursing notes.  Pertinent labs & imaging results that were available during my care of the patient were reviewed by me and considered in my medical decision making (see chart for details).    MDM Rules/Calculators/A&P                      Here post-arrest.  Asystole then PEA then ROSC after 8 minutes of CPR and 1 round Epi.  Sinus tachycardia on arrival.  GCS 3.  Unknown downtime.    King airway exchanged for ETT as above.  POCUS without pericardial  effusion, right heart strain or pneumothorax.  CT with mild diffuse loss of sulcal markings.  CT c-spine without evidence of injury.    2000- Spoke with the pt's father, updated on the pt's clinical condition.  He believes the pt may have overdosed on "Xanax bars".  Confirms the pt is full code at this time.   Lactic acid 8.3 and given fluid bolus.   Arrest likely secondary to overdose.    Consulted Critical Care for admission, awaiting bed assignment.     Final Clinical Impression(s) / ED Diagnoses Final diagnoses:  Cardiac arrest [facility])    Rx / DC Orders ED Discharge Orders    None       Jahniyah Revere, [place], MD 04/28/19 3419    Derwood Kaplan, MD 04/28/19 2030

## 2019-04-28 ENCOUNTER — Inpatient Hospital Stay (HOSPITAL_COMMUNITY): Payer: Medicaid Other

## 2019-04-28 DIAGNOSIS — T17908A Unspecified foreign body in respiratory tract, part unspecified causing other injury, initial encounter: Secondary | ICD-10-CM

## 2019-04-28 DIAGNOSIS — E872 Acidosis, unspecified: Secondary | ICD-10-CM

## 2019-04-28 DIAGNOSIS — Z978 Presence of other specified devices: Secondary | ICD-10-CM

## 2019-04-28 DIAGNOSIS — I469 Cardiac arrest, cause unspecified: Secondary | ICD-10-CM | POA: Diagnosis not present

## 2019-04-28 DIAGNOSIS — G40901 Epilepsy, unspecified, not intractable, with status epilepticus: Secondary | ICD-10-CM

## 2019-04-28 LAB — CBC
HCT: 47.3 % (ref 39.0–52.0)
Hemoglobin: 16.3 g/dL (ref 13.0–17.0)
MCH: 32.4 pg (ref 26.0–34.0)
MCHC: 34.5 g/dL (ref 30.0–36.0)
MCV: 94 fL (ref 80.0–100.0)
Platelets: 309 10*3/uL (ref 150–400)
RBC: 5.03 MIL/uL (ref 4.22–5.81)
RDW: 13.1 % (ref 11.5–15.5)
WBC: 24.6 10*3/uL — ABNORMAL HIGH (ref 4.0–10.5)
nRBC: 0 % (ref 0.0–0.2)

## 2019-04-28 LAB — COMPREHENSIVE METABOLIC PANEL
ALT: 133 U/L — ABNORMAL HIGH (ref 0–44)
AST: 81 U/L — ABNORMAL HIGH (ref 15–41)
Albumin: 3.9 g/dL (ref 3.5–5.0)
Alkaline Phosphatase: 85 U/L (ref 38–126)
Anion gap: 14 (ref 5–15)
BUN: 15 mg/dL (ref 6–20)
CO2: 22 mmol/L (ref 22–32)
Calcium: 9.1 mg/dL (ref 8.9–10.3)
Chloride: 104 mmol/L (ref 98–111)
Creatinine, Ser: 0.98 mg/dL (ref 0.61–1.24)
GFR calc Af Amer: >60 mL/min (ref >60)
GFR calc non Af Amer: >60 mL/min (ref >60)
Glucose, Bld: 102 mg/dL — ABNORMAL HIGH (ref 70–99)
Potassium: 4.4 mmol/L (ref 3.5–5.1)
Sodium: 140 mmol/L (ref 135–145)
Total Bilirubin: 0.7 mg/dL (ref 0.3–1.2)
Total Protein: 6.3 g/dL — ABNORMAL LOW (ref 6.5–8.1)

## 2019-04-28 LAB — PHOSPHORUS
Phosphorus: 3.4 mg/dL (ref 2.5–4.6)
Phosphorus: 2.6 mg/dL (ref 2.5–4.6)
Phosphorus: 3.6 mg/dL (ref 2.5–4.6)

## 2019-04-28 LAB — POCT I-STAT 7, (LYTES, BLD GAS, ICA,H+H)
Acid-base deficit: 3 mmol/L — ABNORMAL HIGH (ref 0.0–2.0)
Bicarbonate: 22.6 mmol/L (ref 20.0–28.0)
Calcium, Ion: 1.18 mmol/L (ref 1.15–1.40)
HCT: 46 % (ref 39.0–52.0)
Hemoglobin: 15.6 g/dL (ref 13.0–17.0)
O2 Saturation: 98 %
Patient temperature: 98
Potassium: 3.6 mmol/L (ref 3.5–5.1)
Sodium: 138 mmol/L (ref 135–145)
TCO2: 24 mmol/L (ref 22–32)
pCO2 arterial: 42.1 mmHg (ref 32.0–48.0)
pH, Arterial: 7.336 — ABNORMAL LOW (ref 7.350–7.450)
pO2, Arterial: 110 mmHg — ABNORMAL HIGH (ref 83.0–108.0)

## 2019-04-28 LAB — MAGNESIUM
Magnesium: 2 mg/dL (ref 1.7–2.4)
Magnesium: 1.9 mg/dL (ref 1.7–2.4)
Magnesium: 2 mg/dL (ref 1.7–2.4)

## 2019-04-28 LAB — HEMOGLOBIN A1C
Hgb A1c MFr Bld: 4.6 % — ABNORMAL LOW (ref 4.8–5.6)
Mean Plasma Glucose: 85.32 mg/dL

## 2019-04-28 LAB — GLUCOSE, CAPILLARY
Glucose-Capillary: 105 mg/dL — ABNORMAL HIGH (ref 70–99)
Glucose-Capillary: 126 mg/dL — ABNORMAL HIGH (ref 70–99)
Glucose-Capillary: 103 mg/dL — ABNORMAL HIGH (ref 70–99)

## 2019-04-28 LAB — TRIGLYCERIDES: Triglycerides: 45 mg/dL (ref <150)

## 2019-04-28 LAB — NA AND K (SODIUM & POTASSIUM), RAND UR
Potassium Urine: 25 mmol/L
Sodium, Ur: 101 mmol/L

## 2019-04-28 LAB — AMMONIA: Ammonia: 27 umol/L (ref 9–35)

## 2019-04-28 LAB — OSMOLALITY, URINE: Osmolality, Ur: 526 mOsm/kg (ref 300–900)

## 2019-04-28 LAB — MRSA PCR SCREENING: MRSA by PCR: NEGATIVE

## 2019-04-28 LAB — CORTISOL: Cortisol, Plasma: 41.5 ug/dL

## 2019-04-28 MED ORDER — VALPROIC ACID 250 MG/5ML PO SOLN
1500.0000 mg | Freq: Once | ORAL | Status: AC
  Administered 2019-04-28: 1500 mg
  Filled 2019-04-28: qty 30

## 2019-04-28 MED ORDER — VITAL HIGH PROTEIN PO LIQD
1000.0000 mL | ORAL | Status: DC
  Administered 2019-04-28 – 2019-04-29 (×2): 1000 mL

## 2019-04-28 MED ORDER — MIDAZOLAM HCL 2 MG/2ML IJ SOLN
2.0000 mg | INTRAMUSCULAR | Status: DC | PRN
  Administered 2019-04-28 (×2): 2 mg via INTRAVENOUS
  Filled 2019-04-28 (×2): qty 2

## 2019-04-28 MED ORDER — ORAL CARE MOUTH RINSE
15.0000 mL | OROMUCOSAL | Status: DC
  Administered 2019-04-28 – 2019-05-02 (×40): 15 mL via OROMUCOSAL

## 2019-04-28 MED ORDER — ASPIRIN 81 MG PO CHEW
81.0000 mg | CHEWABLE_TABLET | Freq: Every day | ORAL | Status: DC
  Administered 2019-04-29 – 2019-04-30 (×2): 81 mg via ORAL
  Filled 2019-04-28 (×2): qty 1

## 2019-04-28 MED ORDER — MIDAZOLAM 50MG/50ML (1MG/ML) PREMIX INFUSION
5.0000 mg/h | INTRAVENOUS | Status: DC
  Administered 2019-04-28 – 2019-04-30 (×3): 5 mg/h via INTRAVENOUS
  Filled 2019-04-28 (×5): qty 50

## 2019-04-28 MED ORDER — PRO-STAT SUGAR FREE PO LIQD
30.0000 mL | Freq: Two times a day (BID) | ORAL | Status: DC
  Administered 2019-04-28 – 2019-04-30 (×5): 30 mL
  Filled 2019-04-28 (×5): qty 30

## 2019-04-28 MED ORDER — CHLORHEXIDINE GLUCONATE CLOTH 2 % EX PADS
6.0000 | MEDICATED_PAD | Freq: Every day | CUTANEOUS | Status: DC
  Administered 2019-04-28 – 2019-04-30 (×3): 6 via TOPICAL

## 2019-04-28 MED ORDER — LACTATED RINGERS IV BOLUS
500.0000 mL | Freq: Once | INTRAVENOUS | Status: AC
  Administered 2019-04-28: 500 mL via INTRAVENOUS

## 2019-04-28 MED ORDER — CHLORHEXIDINE GLUCONATE 0.12% ORAL RINSE (MEDLINE KIT)
15.0000 mL | Freq: Two times a day (BID) | OROMUCOSAL | Status: DC
  Administered 2019-04-28 – 2019-05-01 (×8): 15 mL via OROMUCOSAL

## 2019-04-28 MED ORDER — SODIUM CHLORIDE 0.9 % IV SOLN
2000.0000 mg | INTRAVENOUS | Status: AC
  Administered 2019-04-28: 2000 mg via INTRAVENOUS
  Filled 2019-04-28: qty 20

## 2019-04-28 MED ORDER — VALPROIC ACID 250 MG/5ML PO SOLN
500.0000 mg | Freq: Two times a day (BID) | ORAL | Status: DC
  Administered 2019-04-29 – 2019-04-30 (×3): 500 mg via ORAL
  Filled 2019-04-28 (×4): qty 10

## 2019-04-28 MED ORDER — LEVETIRACETAM IN NACL 1000 MG/100ML IV SOLN
1000.0000 mg | Freq: Two times a day (BID) | INTRAVENOUS | Status: DC
  Administered 2019-04-29 – 2019-04-30 (×4): 1000 mg via INTRAVENOUS
  Filled 2019-04-28 (×4): qty 100

## 2019-04-28 NOTE — Progress Notes (Signed)
EEG complete - results pending 

## 2019-04-28 NOTE — Progress Notes (Signed)
NAME:  Andrew Christensen, MRN:  [id number], DOB:  1995/11/24, LOS: 1 ADMISSION DATE:  04/24/2019, CONSULTATION DATE:  04/28/19 REFERRING MD: Durene Cal  , CHIEF COMPLAINT:  Cardiac Arrest   Brief History   24 year old male with a history of polysubstance abuse who was found unresponsive at home, status post 8 minutes of CPR by EMS with ROSC.  Intubated and PCCM asked to admit  History of present illness   24 year old male with a past medical history of polysubstance abuse who is coming from home after being found unresponsive.  His father is at the bedside and states that they work together all day, he seemed happy and to be doing well, he dropped him off at home around 5:55 PM and went back out again.  He is unsure of the timing, but thinks he came back home about 40 minutes later and found his son naked and curled up in the corner of the bathroom unresponsive and not breathing; states he was blue around the mouth.  His father immediately called 911 and started CPR, on EMS arrival patient was in asystole.  CPR for approximately 8 minutes with 1 epi given and then ROSC with sinus tach.  Patient was intubated and transported to the emergency department.  CT head shows diffuse loss of sulcal markings, no definite edema.  CXR clear, but CT cervical spine with confluent bilateral pulmonary opacities in the visualized lung apices concerning for multifocal pneumonia.  UDS positive for heroine and amphetamines.  Lactic acid up-trending from 3.9 to 8.3.  He was given fentanyl and Versed in the ED, has had some myoclonus, and breathing over the vent, but otherwise not responsive.  He is prescribed Adderall, but his father has been concerned that he might return to snorting heroin as he has in the past   Patient with was admitted in January 2021 for psychosis and polysubstance abuse (cannabis, K2, Adderall) and was discharged home at that time.  His father notes no recent suicidal ideation or obvious depression though  previous psych note states: "His father reports that his mother was addicted to numerous opiates in her lifetime and actually died 3 years ago from complications from opiate dependency but states that she actually gave Marquis pills in the sixth grade, he was actually taking and selling Percocet at that age."   Past Medical History  Polysubstance abuse, ADD  Significant Hospital Events   3/5 admit to PCCM  Consults:    Procedures:  3/5 ETT  Significant Diagnostic Tests:  3/5 CT head>>Mild diffuse loss of sulcal markings when compared to the prior study dated March 18, 2019, without definite evidence of diffuse cerebral edema. MRI correlation is recommended. 3/5 CT C-spine>>Confluent bilateral pulmonary opacities concerning for multifocal pneumonia. Clinical correlation is recommended. 3/6 MRI: Probable early changes of hypoxic ischemic injury, manifest as abnormal early restricted diffusion and T2 signal within the caudate and putamen portion of the basal ganglia and probably affecting the diffuse cortical brain.  Micro Data:  3/5 SARS-Cov-2>> negative 3/5 blood cultures x2>>  Antimicrobials:  Zosyn 3/5-  Interim history/subjective:  3/6: mri with probable anoxic injury. Neuro consult pending.   Objective   Blood pressure (!) 143/92, pulse 98, temperature 99.8 F (37.7 C), temperature source Axillary, resp. rate (!) 28, weight 97.5 kg, SpO2 100 %.    Vent Mode: PRVC FiO2 (%):  [40 %-60 %] 40 % Set Rate:  [18 bmp] 18 bmp Vt Set:  [650 mL] 650 mL PEEP:  [5 cmH20]  [place] Pressure:  [16 cmH20-20 cmH20] 20 cmH20   Intake/Output Summary (Last 24 hours) at 04/28/2019 1134 Last data filed at 04/28/2019 0800 Gross per 24 hour  Intake 1263.89 ml  Output 925 ml  Net 338.89 ml   Filed Weights   04/26/2019 2025 04/28/19 0500  Weight: 97.5 kg 97.5 kg    General: Well-nourished male intubated and sedated HEENT: MM pink/moist, sclera injected Neuro: sustained seizure  like activity with eyes deviated upward. +myoclonus CV: s1s2 RRR, no m/r/g PULM: ET tube in place, CTAB GI: soft, bsx4 active  Extremities: warm/dry, no edema  Skin: no rashes or lesions   Resolved Hospital Problem list   none  Assessment & Plan:   PEA arrest s/p ROSC with subsequent hypoxic respiratory failure and intubation -not a candidate for cooling due to unknown down time -P: -Continue full ventilatory support -Propofol for agitation -Obtain EEG, awaiting read -mri with early hypoxic injury seen -active sz like activity on clinical exam. Consulted neuro -loading with keppra at this time.  --Maintain full vent support with SAT/SBT as tolerated -titrate Vent setting to maintain SpO2 greater than or equal to 90%. -HOB elevated 30 degrees. -Plateau pressures less than 30 cm H20.  -Follow chest x-ray, ABG prn.   -Bronchial hygiene and RT/bronchodilator protocol. -Guarded prognosis given unknown downtime Acute encephalopathy Status myoclonus:  2/2 above -eeg ongoing -neuro consult pending -keppra -propofol  Likely aspiration pneumonia -Bilateral upper lobe infiltrates on CT -Covid negative P: -blood cx pending -cont zosyn for now.      ARF  P:  -improving -adequate uop -cr 1.5->0.9   Hyperglycemia -no history of DM P: -a1c 4.6 - treat with SSI   Elevated Transaminases -likely early shock liver P: -downtrending   Polysubstance abuse -Most suspicious of heroin overdose P: -If patient recovers will need psychiatric support      Best practice:  Diet: N.p.o. starting tf Pain/Anxiety/Delirium protocol (if indicated): Propofol VAP protocol (if indicated): Head of the bed 30 degrees, daily SBT DVT prophylaxis: Lovenox GI prophylaxis: Pepcid Glucose control: SSI Mobility: Bedrest Code Status: Full code Family Communication: attempting to find father's number. Not listed in chart at this time.  Disposition: ICU  Labs   CBC: Recent Labs    Lab 05/05/2019 1925 05/06/2019 1953 04/28/19 0018 04/28/19 0648  WBC 13.7*  --   --  24.6*  NEUTROABS 7.4  --   --   --   HGB 14.7 15.0 15.6 16.3  HCT 44.9 44.0 46.0 47.3  MCV 100.4*  --   --  94.0  PLT 319  --   --  277    Basic Metabolic Panel: Recent Labs  Lab 05/04/2019 1925 04/29/2019 1953 04/28/19 0018 04/28/19 0648  NA 133* 133* 138 140  K 4.5 4.2 3.6 4.4  CL 99  --   --  104  CO2 19*  --   --  22  GLUCOSE 382*  --   --  102*  BUN 17  --   --  15  CREATININE 1.50*  --   --  0.98  CALCIUM 8.6*  --   --  9.1  MG  --   --   --  2.0  PHOS  --   --   --  3.4   GFR: CrCl cannot be calculated (Unknown ideal weight.). Recent Labs  Lab 05/19/2019 1925 04/25/2019 2125 04/28/19 0648  WBC 13.7*  --  24.6*  LATICACIDVEN 8.3* 3.9*  --  Liver Function Tests: Recent Labs  Lab 04/26/2019 1925 04/28/19 0648  AST 122* 81*  ALT 134* 133*  ALKPHOS 87 85  BILITOT 0.5 0.7  PROT 5.9* 6.3*  ALBUMIN 3.7 3.9   No results for input(s): LIPASE, AMYLASE in the last 168 hours. Recent Labs  Lab 05/06/2019 1939  AMMONIA 75*    ABG    Component Value Date/Time   PHART 7.336 (L) 04/28/2019 0018   PCO2ART 42.1 04/28/2019 0018   PO2ART 110.0 (H) 04/28/2019 0018   HCO3 22.6 04/28/2019 0018   TCO2 24 04/28/2019 0018   ACIDBASEDEF 3.0 (H) 04/28/2019 0018   O2SAT 98.0 04/28/2019 0018     Coagulation Profile: Recent Labs  Lab 05/16/2019 1925  INR 1.1    Cardiac Enzymes: No results for input(s): CKTOTAL, CKMB, CKMBINDEX, TROPONINI in the last 168 hours.  HbA1C: Hgb A1c MFr Bld  Date/Time Value Ref Range Status  04/28/2019 12:22 AM 4.6 (L) 4.8 - 5.6 % Final    Comment:    (NOTE) Pre diabetes:          5.7%-6.4% Diabetes:              >6.4% Glycemic control for   <7.0% adults with diabetes     CBG: Recent Labs  Lab 05/18/2019 1925  GLUCAP 334*     Critical care time: The patient is critically ill with multiple organ systems failure and requires high complexity  decision making for assessment and support, frequent evaluation and titration of therapies, application of advanced monitoring technologies and extensive interpretation of multiple databases.  Critical care time 39 mins. This represents my time independent of the NPs time taking care of the pt. This is excluding procedures.    Briant Sites DO [facility] 04/28/2019, 11:34 AM

## 2019-04-28 NOTE — Progress Notes (Signed)
Myoclonus became more frequent:  Increase propofol to 70 mcg/kg/min, Continue 5 mg/h of midozalam Continue Keppra Loaded with Depakene solution (IV valproic acid not available)  Signed out to Dr. Amada Jupiter.  Will follow EEG and titrate sedation as needed.

## 2019-04-28 NOTE — Progress Notes (Signed)
eLink Physician-Brief Progress Note Patient Name: Andrew Christensen DOB: 1995/12/06 MRN: [id number]   Date of Service  04/28/2019  HPI/Events of Note  10 M polysubstance abuse, found unresponsive by father who initiated CPR. He was asystole on EMS arrival with ROSC after 1 dose pf epi. tox screen positive for amphetamines and opiates.  Head CT with mild diffuse loss of sulcal markings  eICU Interventions   Patient reportedly biting on ET tube, sedated on propofol. Added Versed prn  Prognosis for neurologic recovery guarded     Intervention Category Major Interventions: Respiratory failure - evaluation and management;Change in mental status - evaluation and management Minor Interventions: Agitation / anxiety - evaluation and management Evaluation Type: New Patient Evaluation  Darl Pikes 04/28/2019, 1:51 AM

## 2019-04-28 NOTE — Progress Notes (Signed)
LTM EEG hooked up and running - no initial skin breakdown - push button tested - neuro notified.  

## 2019-04-28 NOTE — Progress Notes (Signed)
eLink Physician-Brief Progress Note Patient Name: Andrew Christensen DOB: 1995-04-04 MRN: [id number]   Date of Service  04/28/2019  HPI/Events of Note  Notified of 100 cc UO in the past 4 hours, also with myoclonic jerks  eICU Interventions  Bolus 500 cc LRS Continue propofol and prn Versed, for EEH in am     Intervention Category Intermediate Interventions: Oliguria - evaluation and management  Darl Pikes 04/28/2019, 4:55 AM

## 2019-04-28 NOTE — Procedures (Signed)
Patient Name: Andrew Christensen  MRN: [id number]  Epilepsy Attending: Charlsie Quest  Referring Physician/Provider: Emilie Rutter, PA Date: 04/28/2019 Duration: 27.456 mins  Patient history: 23yo M found down and noted to have myoclonic jerks. EEG to evaluate for seizure  Level of alertness: comatose  AEDs during EEG study: Versed, propofol  Technical aspects: This EEG study was done with scalp electrodes positioned according to the 10-20 International system of electrode placement. Electrical activity was acquired at a sampling rate of 500Hz  and reviewed with a high frequency filter of 70Hz  and a low frequency filter of 1Hz . EEG data were recorded continuously and digitally stored.   DESCRIPTION: EEG showed generalized background suppression. Frequent episodes of axial jerk with eye opening were noted. Concomitant EEG showed generalized high amplitude polyspikes. Hyperventilation and photic stimulation were not performed.  ABNORMALITY - Status myoclonus, generalized -Background suppression, generalized  IMPRESSION: This study showed evidence of status myoclonus with generalized onset as well as profound diffuse encephalopathy, non specific to etiology.   Dr was immediately notified.   Stephaniemarie Stoffel 

## 2019-04-28 NOTE — Consult Note (Addendum)
NEURO HOSPITALIST CONSULT NOTE   Requesting physician: Dr. Gilford Raid   Reason for Consult: myoclonic jerking s/p cardiac arrest   History obtained from:   Chart review  HPI:                                                                                                                                          Andrew Christensen is an 24 y.o. male  With [place] poly substance abuse was found down on 04/26/2019 unresponsive at home. Neurology consulted for myoclonic jerks s/p cardiac arrest.   Patient was found down in home yesterday. He received 8 minutes of CPR with ROSC. Now intubated and sedated. 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  About approx. 40 minutes later when he returned home he found his son naked, curled up in the bathroom and unresponsive. He was blue around the mouth. Father called 65 and started CPR.   UDS: + heroin and amphetamines CTH: diffuse loss of sulcal markings, without evidence of diffuse cerebral edema.   MRI: probable early ischemic changes. abnormal early restricted diffusion and T2 signal within the caudate and putamen portion of the basal ganglia and probably affecting the diffuse cortical brain.  History reviewed. No pertinent past medical history.  History reviewed. No pertinent surgical history.  No family history on file.           Social History:  reports that he has been smoking. He does not have any smokeless tobacco history on file. He reports current alcohol use. He reports current drug use. Drug: Marijuana.  No Known Allergies  MEDICATIONS:                                                                                                                     Scheduled: . [START ON 04/29/2019] aspirin  81 mg Oral Daily  . chlorhexidine gluconate (MEDLINE KIT)  15 mL Mouth Rinse BID  . Chlorhexidine Gluconate Cloth  6 each Topical Q0600  . enoxaparin (LOVENOX) injection   40 mg Subcutaneous Q24H  . feeding supplement (PRO-STAT SUGAR FREE 64)  30 mL Per Tube BID  . feeding supplement (VITAL HIGH PROTEIN)  1,000 mL Per Tube Q24H  . mouth  rinse  15 mL Mouth Rinse 10 times per day   Continuous: . famotidine (PEPCID) IV Stopped (04/28/19 1236)  . lactated ringers 75 mL/hr at 05/22/2019 2350  . [START ON 04/29/2019] levETIRAcetam    . midazolam 5 mg/hr (04/28/19 1239)  . piperacillin-tazobactam (ZOSYN)  IV Stopped (04/28/19 1003)  . propofol (DIPRIVAN) infusion 70 mcg/kg/min (04/28/19 1239)   TOI:[id number]   ROS:                                                                                                                                        unobtainable from patient due to mental status/intubated/sedated     Blood pressure (!) 143/99, pulse (!) 115, temperature 99.3 F (37.4 C), temperature source Axillary, resp. rate (!) 28, weight 97.5 kg, SpO2 100 %.   General Examination:                                                                                                       Physical Exam  Constitutional: Appears well-developed and well-nourished.  Psych: Affect appropriate to situation Eyes: Normal external eye and conjunctiva. HENT: Normocephalic, no lesions, without obvious abnormality.   Musculoskeletal-no joint tenderness, deformity or swelling Cardiovascular: Normal rate and regular rhythm.  Respiratory: Effort normal, non-labored breathing saturations WNL GI: Soft.  No distension. There is no tenderness.  Skin: WDI  Neurological Examination Mental Status: Patient does not respond to verbal stimuli.  Does not respond to deep sternal rub.  Does not follow commands.  No verbalizations noted.  Cranial Nerves: II: patient does not respond confrontation bilaterally,  III,IV,VI:pupils right 1 mm, left 1 mm,and non reactive bilaterally,  V,VII: corneal reflex absent bilaterally  VIII: patient does not respond to verbal stimuli IX,X: gag  reflex absent, XI: trapezius strength unable to test bilaterally XII: tongue strength unable to test Motor: Extremities flaccid throughout.  Patient displays some myoclonic jerking and at that time his eye lids will also pop open and close once his body relaxes. No purposeful movements noted. Sensory: Does not respond to noxious stimuli in any extremity. Deep Tendon Reflexes:  2+ patella and biceps. Sustained clonus bilaterally  Plantars: absent bilaterally Cerebellar: Unable to perform     Lab Results: Basic Metabolic Panel: Recent Labs  Lab 05/03/2019 1925 04/23/2019 1953 04/28/19 0018 04/28/19 0648  NA 133* 133* 138 140  K 4.5 4.2 3.6 4.4  CL 99  --   --  104  CO2 19*  --   --  22  GLUCOSE 382*  --   --  102*  BUN 17  --   --  15  CREATININE 1.50*  --   --  0.98  CALCIUM 8.6*  --   --  9.1  MG  --   --   --  2.0  PHOS  --   --   --  3.4    CBC: Recent Labs  Lab 04/25/2019 1925 04/23/2019 1953 04/28/19 0018 04/28/19 0648  WBC 13.7*  --   --  24.6*  NEUTROABS 7.4  --   --   --   HGB 14.7 15.0 15.6 16.3  HCT 44.9 44.0 46.0 47.3  MCV 100.4*  --   --  94.0  PLT 319  --   --  309     Lipid Panel: Recent Labs  Lab 04/28/19 0649  TRIG 45    Imaging: CT Head Wo Contrast  Result Date: 04/25/2019 CLINICAL DATA:  Altered mental status. EXAM: CT HEAD WITHOUT CONTRAST TECHNIQUE: Contiguous axial images were obtained from the base of the skull through the vertex without intravenous contrast. COMPARISON:  March 18, 2019 FINDINGS: Brain: No evidence of acute infarction, hemorrhage, hydrocephalus, extra-axial collection or mass lesion/mass effect. Mild diffuse loss of sulcal markings is noted when compared to the prior study. Vascular: No hyperdense vessel or unexpected calcification. Skull: Normal. Negative for fracture or focal lesion. Sinuses/Orbits: An 8 mm x 4 mm polyp versus mucous retention cyst is seen within the posteromedial aspect of the right maxillary sinus.  Other: None. IMPRESSION: 1. Mild diffuse loss of sulcal markings when compared to the prior study dated March 18, 2019, without definite evidence of diffuse cerebral edema. MRI correlation is recommended. Electronically Signed   By: Virgina Norfolk M.D.   On: 04/24/2019 20:30   CT Cervical Spine Wo Contrast  Result Date: 05/22/2019 CLINICAL DATA:  24 year old male with altered mental status. EXAM: CT CERVICAL SPINE WITHOUT CONTRAST TECHNIQUE: Multidetector CT imaging of the cervical spine was performed without intravenous contrast. Multiplanar CT image reconstructions were also generated. COMPARISON:  None. FINDINGS: Evaluation of this exam is limited due to motion artifact. Alignment: No acute subluxation. Skull base and vertebrae: No definite acute fracture. Soft tissues and spinal canal: No prevertebral fluid or swelling. No visible canal hematoma. Disc levels: No acute findings. No significant degenerative changes. Upper chest: Confluent bilateral pulmonary opacities in the visualized lung apices concerning for multifocal pneumonia. Clinical correlation is recommended. Other: An endotracheal and an enteric tube are partially visualized. IMPRESSION: 1. No definite acute/traumatic cervical spine pathology. 2. Confluent bilateral pulmonary opacities concerning for multifocal pneumonia. Clinical correlation is recommended. Electronically Signed   By: Anner Crete M.D.   On: 05/06/2019 20:30   MR BRAIN WO CONTRAST  Result Date: 04/28/2019 CLINICAL DATA:  Cardiac arrest.  Brain swelling by CT. EXAM: MRI HEAD WITHOUT CONTRAST TECHNIQUE: Multiplanar, multiecho pulse sequences of the brain and surrounding structures were obtained without intravenous contrast. COMPARISON:  Head CT 05/07/2019 FINDINGS: Brain: Findings suggest hypoxic ischemic brain injury. Abnormal early restricted diffusion and T2 signal affecting the basal ganglia/corpus striatum portion. Question early/mild diffuse cortical edema. No sign  of mass lesion, hemorrhage, hydrocephalus or extra-axial collection. 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 Vascular: Major vessels at the base of the brain show flow. Skull and upper cervical spine: Negative Sinuses/Orbits: Clear/normal Other: None IMPRESSION: Probable early changes of hypoxic ischemic injury, manifest as abnormal early restricted diffusion and T2 signal within the caudate  and putamen portion of the basal ganglia and probably affecting the diffuse cortical brain. Electronically Signed   By: Nelson Chimes M.D.   On: 04/28/2019 04:22   DG Chest Portable 1 View  Result Date: 04/24/2019 CLINICAL DATA:  Status post intubation and nasogastric tube placement. EXAM: PORTABLE CHEST 1 VIEW COMPARISON:  March 18, 2019 FINDINGS: Since the prior study there is been placement of an endotracheal tube. Its distal tip is approximately 5.3 cm from the carina. Interval nasogastric tube placement is seen with its distal tip overlying the expected region of the gastric fundus. There is no evidence of acute infiltrate, pleural effusion or pneumothorax. The heart size and mediastinal contours are within normal limits. The visualized skeletal structures are unremarkable. IMPRESSION: 1. Interval endotracheal tube and nasogastric tube placement and positioning, as described above, when compared to the prior study dated March 18, 2019. 2. No acute or active cardiopulmonary disease. Electronically Signed   By: Virgina Norfolk M.D.   On: 05/23/2019 19:44   DG Abd Portable 1V  Result Date: 04/28/2019 CLINICAL DATA:  NG/OG tube placement. EXAM: PORTABLE ABDOMEN - 1 VIEW 6:07 a.m.: COMPARISON:  Portable abdomen x-ray earlier same day at 12:02 a.m. FINDINGS: NG/OG tube looped in the stomach with its tip in the mid body. Bowel gas pattern unremarkable. IMPRESSION: 1. NG/OG tube looped in the stomach with its tip in the mid body. 2. No acute abdominal abnormality. Electronically Signed   By: Evangeline Dakin M.D.   On: 04/28/2019 08:12   DG Abd Portable 1 View  Result Date: 04/28/2019 CLINICAL DATA:  MRI clearance EXAM: PORTABLE ABDOMEN - 1 VIEW COMPARISON:  None. FINDINGS: The bowel gas pattern is nonobstructive. There is a large amount of stool throughout the visualized colon. The enteric tube tip projects over the gastric body. There is no metallic foreign body identified on this study. EKG leads project over the patient's abdomen and pelvis. IMPRESSION: No metallic foreign body identified on this study. The enteric tube tip projects over the gastric body. Large amount of stool throughout the colon. Electronically Signed   By: Constance Holster M.D.   On: 04/28/2019 00:14    Assessment: 24 year old male with history polysubstance abuse  Was found down and unresponsive at home. 8 mins of CPR given with ROSC. Now having jerking. Loaded with Keppra and starting burst suppression. LTM ordered as well. MRI Brain concerning for early changes of hypoxic ischemic injury.    Recommendations: -- LTM EEG -- ammonia level -- start Keppra 2g now, then 12 hours later 1g keppra BID -- start burst suppression with propofol and Versed.  Will titrate to 1-2 bursts every 10 seconds  Laurey Morale, MSN, NP-C [facility] [phone]  Attending neurologist's note to follow   04/28/2019, 12:47 PM   NEUROHOSPITALIST ADDENDUM Performed a face to face diagnostic evaluation.   I have reviewed the contents of history and physical exam as documented by PA/ARNP/Resident and agree with above documentation.  I have discussed and formulated the above plan as documented. Edits to the note have been made as needed.  24 year old male with polysubstance abuse found down on 05/01/2019-witnessed by his father.  Approximately 40 min he was not seen, 8 minutes of CPR, and and when EMS arrived achieved ROSC in few min.   Started having myoclonic jerks, EEG was obtained-concerning for myoclonic status  epilepticus.  Neurology was consulted. MRI brain already performed-concerning for hypoxic ischemic injury.  On examination, patient has frequent up  rolling of eyes with myoclonic jerks, pupils are 1 mm bilaterally nonreactive and gag reflex absent.  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  Patient on propofol 40 MCG/hr -we will increase to 70.  Started 5 mg/h midozalam drip-we will titrate as needed Loaded with 2 g of Keppra and will start 1 g twice daily.  Also consider valproic acid level if needed, baseline ammonia being checked.  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  Critical care was necessary to treat or prevent imminent or life-threatening deterioration.  Critical care was time spent personally by me on the following activities: development of treatment plan with patient and/or surrogate as well as nursing, discussions with consultants, evaluation of patient's response to treatment, examination of patient, obtaining history from patient or surrogate, ordering and performing treatments and interventions, ordering and review of laboratory studies, ordering and review of radiographic studies, pulse oximetry and re-evaluation of patient's condition.     Karena Addison  MD [facility] [phone]   If 7pm to 7am, please call on call as listed on AMION.

## 2019-04-28 NOTE — Progress Notes (Signed)
PHARMACY - PHYSICIAN COMMUNICATION CRITICAL VALUE ALERT - BLOOD CULTURE IDENTIFICATION (BCID)  Andrew Christensen is an 24 y.o. male who presented to [facility] on 05/18/2019 after being found unresponsive at home.  He is s/p 8 minutes of CPR by EMS with ROSC.  Assessment: being treated for asp PNA; 1 of 6 bottles growing GPC, likely a contaminant.  Afebrile, WBC 24.6  Name of physician (or Provider) Contacted:  Dr. Gaynell Face  Current antibiotics: Zosyn  Changes to prescribed antibiotics recommended:  Patient is on recommended antibiotics - No changes needed   [name] D. Laney Potash, PharmD, BCPS, BCCCP 04/28/2019, 12:54 PM

## 2019-04-29 DIAGNOSIS — J690 Pneumonitis due to inhalation of food and vomit: Secondary | ICD-10-CM | POA: Diagnosis not present

## 2019-04-29 DIAGNOSIS — J9601 Acute respiratory failure with hypoxia: Secondary | ICD-10-CM

## 2019-04-29 DIAGNOSIS — G936 Cerebral edema: Secondary | ICD-10-CM | POA: Diagnosis not present

## 2019-04-29 DIAGNOSIS — T401X1A Poisoning by heroin, accidental (unintentional), initial encounter: Secondary | ICD-10-CM | POA: Diagnosis not present

## 2019-04-29 DIAGNOSIS — J9602 Acute respiratory failure with hypercapnia: Secondary | ICD-10-CM | POA: Diagnosis not present

## 2019-04-29 LAB — CBC
HCT: 41.1 % (ref 39.0–52.0)
Hemoglobin: 13.6 g/dL (ref 13.0–17.0)
MCH: 32.4 pg (ref 26.0–34.0)
MCHC: 33.1 g/dL (ref 30.0–36.0)
MCV: 97.9 fL (ref 80.0–100.0)
Platelets: 249 10*3/uL (ref 150–400)
RBC: 4.2 MIL/uL — ABNORMAL LOW (ref 4.22–5.81)
RDW: 13.8 % (ref 11.5–15.5)
WBC: 14.3 10*3/uL — ABNORMAL HIGH (ref 4.0–10.5)
nRBC: 0 % (ref 0.0–0.2)

## 2019-04-29 LAB — VALPROIC ACID LEVEL: Valproic Acid Lvl: 35 ug/mL — ABNORMAL LOW (ref 50.0–100.0)

## 2019-04-29 LAB — BASIC METABOLIC PANEL
Anion gap: 11 (ref 5–15)
BUN: 13 mg/dL (ref 6–20)
CO2: 23 mmol/L (ref 22–32)
Calcium: 8.6 mg/dL — ABNORMAL LOW (ref 8.9–10.3)
Chloride: 106 mmol/L (ref 98–111)
Creatinine, Ser: 0.64 mg/dL (ref 0.61–1.24)
GFR calc Af Amer: >60 mL/min (ref >60)
GFR calc non Af Amer: >60 mL/min (ref >60)
Glucose, Bld: 123 mg/dL — ABNORMAL HIGH (ref 70–99)
Potassium: 3.5 mmol/L (ref 3.5–5.1)
Sodium: 140 mmol/L (ref 135–145)

## 2019-04-29 LAB — GLUCOSE, CAPILLARY
Glucose-Capillary: 85 mg/dL (ref 70–99)
Glucose-Capillary: 69 mg/dL — ABNORMAL LOW (ref 70–99)
Glucose-Capillary: 86 mg/dL (ref 70–99)
Glucose-Capillary: 88 mg/dL (ref 70–99)
Glucose-Capillary: 95 mg/dL (ref 70–99)
Glucose-Capillary: 138 mg/dL — ABNORMAL HIGH (ref 70–99)

## 2019-04-29 LAB — PHOSPHORUS
Phosphorus: 2.1 mg/dL — ABNORMAL LOW (ref 2.5–4.6)
Phosphorus: 2.2 mg/dL — ABNORMAL LOW (ref 2.5–4.6)

## 2019-04-29 LAB — MAGNESIUM
Magnesium: 2.1 mg/dL (ref 1.7–2.4)
Magnesium: 2 mg/dL (ref 1.7–2.4)
Magnesium: 2 mg/dL (ref 1.7–2.4)

## 2019-04-29 LAB — COMPREHENSIVE METABOLIC PANEL
ALT: 74 U/L — ABNORMAL HIGH (ref 0–44)
AST: 55 U/L — ABNORMAL HIGH (ref 15–41)
Albumin: 2.8 g/dL — ABNORMAL LOW (ref 3.5–5.0)
Alkaline Phosphatase: 62 U/L (ref 38–126)
Anion gap: 8 (ref 5–15)
BUN: 11 mg/dL (ref 6–20)
CO2: 24 mmol/L (ref 22–32)
Calcium: 8.4 mg/dL — ABNORMAL LOW (ref 8.9–10.3)
Chloride: 107 mmol/L (ref 98–111)
Creatinine, Ser: 0.69 mg/dL (ref 0.61–1.24)
GFR calc Af Amer: >60 mL/min (ref >60)
GFR calc non Af Amer: >60 mL/min (ref >60)
Glucose, Bld: 112 mg/dL — ABNORMAL HIGH (ref 70–99)
Potassium: 3.9 mmol/L (ref 3.5–5.1)
Sodium: 139 mmol/L (ref 135–145)
Total Bilirubin: 0.7 mg/dL (ref 0.3–1.2)
Total Protein: 5.4 g/dL — ABNORMAL LOW (ref 6.5–8.1)

## 2019-04-29 LAB — TRIGLYCERIDES: Triglycerides: 122 mg/dL (ref <150)

## 2019-04-29 LAB — LACTIC ACID, PLASMA: Lactic Acid, Venous: 2.9 mmol/L (ref 0.5–1.9)

## 2019-04-29 LAB — CK: Total CK: 1174 U/L — ABNORMAL HIGH (ref 49–397)

## 2019-04-29 LAB — AMMONIA: Ammonia: 76 umol/L — ABNORMAL HIGH (ref 9–35)

## 2019-04-29 MED ORDER — DEXTROSE 50 % IV SOLN
12.5000 g | Freq: Once | INTRAVENOUS | Status: AC
  Administered 2019-04-29: 12.5 g via INTRAVENOUS

## 2019-04-29 MED ORDER — DEXTROSE 50 % IV SOLN
INTRAVENOUS | Status: AC
Start: 2019-04-29 — End: 2019-04-29
  Filled 2019-04-29: qty 50

## 2019-04-29 MED ORDER — NOREPINEPHRINE 4 MG/250ML-% IV SOLN
0.0000 ug/min | INTRAVENOUS | Status: DC
  Administered 2019-04-29 – 2019-04-30 (×2): 2 ug/min via INTRAVENOUS
  Administered 2019-05-01: 3 ug/min via INTRAVENOUS
  Filled 2019-04-29 (×3): qty 250

## 2019-04-29 MED ORDER — LABETALOL HCL 5 MG/ML IV SOLN
10.0000 mg | INTRAVENOUS | Status: DC | PRN
  Administered 2019-04-30: 10 mg via INTRAVENOUS

## 2019-04-29 MED ORDER — POTASSIUM CHLORIDE 20 MEQ/15ML (10%) PO SOLN
40.0000 meq | Freq: Once | ORAL | Status: AC
  Administered 2019-04-29: 40 meq via ORAL
  Filled 2019-04-29: qty 30

## 2019-04-29 MED ORDER — LABETALOL HCL 5 MG/ML IV SOLN
10.0000 mg | INTRAVENOUS | Status: DC | PRN
  Filled 2019-04-29: qty 4

## 2019-04-29 NOTE — Progress Notes (Signed)
eLink Physician-Brief Progress Note Patient Name: Andrew Christensen DOB: 1995-04-06 MRN: [id number]   Date of Service  04/29/2019  HPI/Events of Note  Hypertension and Tachycardia - BP = 173/118 and HR = 120.   eICU Interventions  Will order: 1. Labetalol 10 mg IV Q 30 minutes PRN SBP > 170 or DBP > 100.      Intervention Category Major Interventions: Hypertension - evaluation and management  Andrew Christensen 04/29/2019, 8:12 PM

## 2019-04-29 NOTE — Progress Notes (Addendum)
NEUROLOGY PROGRESS NOTE  24 year old male with a history of polysubstance abuse who was found unresponsive at home with, status post 8 minutes of CPR by EMS with ROSC.  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   Exam: Vitals:   04/29/19 0735 04/29/19 0800  BP: 113/65 116/65  Pulse: 81 78  Resp: 18 18  Temp:  (!) 97.3 F (36.3 C)  SpO2: 100% 100%    ROS: UTA due to mental status.   Physical Exam  Constitutional: Appears well-developed and well-nourished.  HENT: ETT in place Cardiovascular: Normal rate and regular rhythm.  Respiratory: mechanical breathing sounds GI: Soft.  No distension. There is no tenderness.  Skin: WDI   Neuro:  Mental Status: Patient does not respond to verbal stimuli.  Does not respond to deep sternal rub.  Does not follow commands.  No verbalizations noted.  Cranial Nerves: II: patient does not respond confrontation bilaterally,  III,IV,VI: doll's response absent bilaterally. pupils right 2 mm, left 2 mm. V,VII: corneal reflex absent bilaterally  VIII: patient does not respond to verbal stimuli IX,X: gag reflex absent, XI: trapezius strength unable to test bilaterally XII: tongue strength unable to test Motor: Extremities flaccid throughout.  No spontaneous movement noted.  No purposeful movements noted. Sensory: Does not respond to noxious stimuli in any extremity. Deep Tendon Reflexes:  Absent throughout. Plantars: absent bilaterally Cerebellar: Unable to perform     Medications:  Scheduled: . aspirin  81 mg Oral Daily  . chlorhexidine gluconate (MEDLINE KIT)  15 mL Mouth Rinse BID  . Chlorhexidine Gluconate Cloth  6 each Topical Q0600  . enoxaparin (LOVENOX) injection  40 mg Subcutaneous Q24H  . feeding supplement (PRO-STAT SUGAR FREE 64)  30 mL Per Tube BID  . feeding supplement (VITAL HIGH  PROTEIN)  1,000 mL Per Tube Q24H  . mouth rinse  15 mL Mouth Rinse 10 times per day  . valproic acid  500 mg Oral BID    Pertinent Labs/Diagnostics:   CT Head Wo Contrast  Result Date: 05/18/2019 CLINICAL DATA:  Altered mental status. EXAM: CT HEAD WITHOUT CONTRAST TECHNIQUE: Contiguous axial images were obtained from the base of the skull through the vertex without intravenous contrast. COMPARISON:  March 18, 2019 FINDINGS: Brain: No evidence of acute infarction, hemorrhage, hydrocephalus, extra-axial collection or mass lesion/mass effect. Mild diffuse loss of sulcal markings is noted when compared to the prior study. Vascular: No hyperdense vessel or unexpected calcification. Skull: Normal. Negative for fracture or focal lesion. Sinuses/Orbits: An 8 mm x 4 mm polyp versus mucous retention cyst is seen within the posteromedial aspect of the right maxillary sinus. Other: None. IMPRESSION: 1. Mild diffuse loss of sulcal markings when compared to the prior study dated March 18, 2019, without definite evidence of diffuse cerebral edema. MRI correlation is recommended. Electronically Signed   By: Andrew Christensen M.D.   On: 05/04/2019 20:30   CT Cervical Spine Wo Contrast  Result Date: 04/28/2019 CLINICAL DATA:  24 year old male with altered mental status. EXAM: CT CERVICAL SPINE WITHOUT CONTRAST TECHNIQUE: Multidetector CT imaging of the cervical spine was performed without intravenous contrast. Multiplanar CT image reconstructions were also generated. COMPARISON:  None. FINDINGS: Evaluation of this exam is limited due to motion artifact. Alignment: No acute subluxation. Skull base and vertebrae: No definite acute fracture. Soft tissues and spinal canal: No prevertebral fluid or swelling. No visible canal  hematoma. Disc levels: No acute findings. No significant degenerative changes. Upper chest: Confluent bilateral pulmonary opacities in the visualized lung apices concerning for multifocal pneumonia.  Clinical correlation is recommended. Other: An endotracheal and an enteric tube are partially visualized. IMPRESSION: 1. No definite acute/traumatic cervical spine pathology. 2. Confluent bilateral pulmonary opacities concerning for multifocal pneumonia. Clinical correlation is recommended. Electronically Signed   By: Andrew Christensen M.D.   On: 05/10/2019 20:30   MR BRAIN WO CONTRAST  Result Date: 04/28/2019 CLINICAL DATA:  Cardiac arrest.  Brain swelling by CT. EXAM: MRI HEAD WITHOUT CONTRAST TECHNIQUE: Multiplanar, multiecho pulse sequences of the brain and surrounding structures were obtained without intravenous contrast. COMPARISON:  Head CT 05/07/2019 FINDINGS: Brain: Findings suggest hypoxic ischemic brain injury. Abnormal early restricted diffusion and T2 signal affecting the basal ganglia/corpus striatum portion. Question early/mild diffuse cortical edema. No sign of mass lesion, hemorrhage, hydrocephalus or extra-axial collection. 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 Vascular: Major vessels at the base of the brain show flow. Skull and upper cervical spine: Negative Sinuses/Orbits: Clear/normal Other: None IMPRESSION: Probable early changes of hypoxic ischemic injury, manifest as abnormal early restricted diffusion and T2 signal within the caudate and putamen portion of the basal ganglia and probably affecting the diffuse cortical brain. Electronically Signed   By: Andrew Christensen M.D.   On: 04/28/2019 04:22   DG Chest Portable 1 View  Result Date: 05/23/2019 CLINICAL DATA:  Status post intubation and nasogastric tube placement. EXAM: PORTABLE CHEST 1 VIEW COMPARISON:  March 18, 2019 FINDINGS: Since the prior study there is been placement of an endotracheal tube. Its distal tip is approximately 5.3 cm from the carina. Interval nasogastric tube placement is seen with its distal tip overlying the expected region of the gastric fundus. There is no evidence of acute infiltrate,  pleural effusion or pneumothorax. The heart size and mediastinal contours are within normal limits. The visualized skeletal structures are unremarkable. IMPRESSION: 1. Interval endotracheal tube and nasogastric tube placement and positioning, as described above, when compared to the prior study dated March 18, 2019. 2. No acute or active cardiopulmonary disease. Electronically Signed   By: Andrew Christensen M.D.   On: 04/24/2019 19:44   DG Abd Portable 1V  Result Date: 04/28/2019 CLINICAL DATA:  NG/OG tube placement. EXAM: PORTABLE ABDOMEN - 1 VIEW 6:07 a.m.: COMPARISON:  Portable abdomen x-ray earlier same day at 12:02 a.m. FINDINGS: NG/OG tube looped in the stomach with its tip in the mid body. Bowel gas pattern unremarkable. IMPRESSION: 1. NG/OG tube looped in the stomach with its tip in the mid body. 2. No acute abdominal abnormality. Electronically Signed   By: Andrew Christensen M.D.   On: 04/28/2019 08:12   DG Abd Portable 1 View  Result Date: 04/28/2019 CLINICAL DATA:  MRI clearance EXAM: PORTABLE ABDOMEN - 1 VIEW COMPARISON:  None. FINDINGS: The bowel gas pattern is nonobstructive. There is a large amount of stool throughout the visualized colon. The enteric tube tip projects over the gastric body. There is no metallic foreign body identified on this study. EKG leads project over the patient's abdomen and pelvis. IMPRESSION: No metallic foreign body identified on this study. The enteric tube tip projects over the gastric body. Large amount of stool throughout the colon. Electronically Signed   By: Andrew Christensen M.D.   On: 04/28/2019 00:14   EEG adult  Result Date: 04/28/2019 Andrew Havens, MD     04/28/2019  2:41 PM Patient Name: Andrew Christensen MRN: [id number] Epilepsy Attending: Lora Christensen Referring Physician/Provider: Elease Etienne, PA Date: 04/28/2019 Duration: 27.456 mins Patient history: 24yo M found down and noted to have myoclonic jerks. EEG to evaluate for seizure Level of  alertness: comatose AEDs during EEG study: Versed, propofol Technical aspects: This EEG study was done with scalp electrodes positioned according to the 10-20 International system of electrode placement. Electrical activity was acquired at a sampling rate of 500Hz  and reviewed with a high frequency filter of 70Hz  and a low frequency filter of 1Hz . EEG data were recorded continuously and digitally stored. DESCRIPTION: EEG showed generalized background suppression. Frequent episodes of axial jerk with eye opening were noted. Concomitant EEG showed generalized high amplitude polyspikes. Hyperventilation and photic stimulation were not performed. ABNORMALITY - Status myoclonus, generalized -Background suppression, generalized IMPRESSION: This study showed evidence of status myoclonus with generalized onset as well as profound diffuse encephalopathy, non specific to etiology. Dr Andrew Christensen was immediately notified. Andrew Lesch PA-C [facility] [phone]  Assessment/Plan:  24 year old male with polysubstance abuse found down on 04/24/2019-witnessed by his father.  Approximately 40 min he was not seen, 8 minutes of CPR, and and when EMS arrived achieved ROSC in few min.   Started having myoclonic jerks, EEG was obtained-concerning for myoclonic status epilepticus.  Neurology was consulted. MRI brain already performed-concerning for hypoxic ischemic injury.  On examination, patient has frequent up rolling of eyes with myoclonic jerks, pupils are 1 mm bilaterally nonreactive and gag reflex absent. 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  Patient on propofol 70 MCG/hr and Versed 5 mg/h, we will titrate as Christensen. Loaded with 2 g of Keppra , continue 1 g twice daily.    Loaded with Depakene.  Ammonia on admission 75, now wnl (27). Lactic acid on admission 8.3, trending down, now 2.9.   cEEG initially  showed continued generalized status myoclonus,:  Now improved, continues to shows periodic epileptiform discharges, sharp waves in L fronto-temporal region, and generalized background suppression.   Anoxic brain injury  Myoclonic status epilepticus: Resolved at current levels of sedation and antiepileptics, continues to show periodic epileptiform discharges Coma on presentation   Recommendations -Continue EEG for 24 more hours -Continue Keppra 1 g twice daily -Continue Depakene 500 mg twice daily, repeat valproic acid level and ammonia -Continue sedation at current levels, will titrate as Christensen -Repeat MRI brain or CT head after EEG disconnected if no more seizures - Neurology team will continue to treat status myoclonus and follow closely   Andrew Pronto PA-C [facility] [phone]   NEUROHOSPITALIST ADDENDUM Performed a face to face diagnostic evaluation.   I have reviewed the contents of history and physical exam as documented by PA/ARNP/Resident and agree with above documentation.  I have discussed and formulated the above plan as documented. Andrew Christensen.  Unfortunate 24 year old male with history of polysubstance abuse found unresponsive at home, likely downtime 20 to 40 minutes-CPR performed and EMS achieved ROSC in 8 minutes.  Was noted to be status myoclonus-neurology consulted. Increase sedation, start patient on Keppra-continue home myoclonus and then added valproic acid. Today patient is no longer myoclonic status epilepticus, still has frequent periodic epileptiform discharges.  We will continue EEG for 24 more hours.  If he has no further myoclonic seizures then we can discontinue EEG and obtain imaging to see if patient has had severe brain injury.  Too early  to prognosticate at this time, however given patient presented with anoxic myoclonus-I am very concerned that he is going to have a poor prognosis.  Given his  young age, we are treating him aggressively.  Addendum - spoke to father, updated him on plan.  Expressed my concern that this may be a severe injury.  Will monitor for further seizure activity and then plan on getting brain imaging   CRITICAL CARE Performed by: Andrew Christensen   Total critical care time: 40 minutes  Critical care time was exclusive of separately billable procedures and treating other patients.  Critical care was necessary to treat or prevent imminent or life-threatening deterioration.  Critical care was time spent personally by me on the following activities: development of treatment plan with patient and/or surrogate as well as nursing, discussions with consultants, evaluation of patient's response to treatment, examination of patient, obtaining history from patient or surrogate, ordering and performing treatments and interventions, ordering and review of laboratory studies, ordering and review of radiographic studies, pulse oximetry and re-evaluation of patient's condition.    Karena Addison Zariah Cavendish MD [facility] [phone]   If 7pm to 7am, please call on call as listed on AMION.

## 2019-04-29 NOTE — Progress Notes (Signed)
eLink Physician-Brief Progress Note Patient Name: Andrew Christensen DOB: September 29, 1995 MRN: [id number]   Date of Service  04/29/2019  HPI/Events of Note  K+ = 3.5, Mg++ = 2.0 and Creatinine = 0.64. Ca++ = 8.6 which corrects to 9.56 (Normal) given albumin = 2.8.  eICU Interventions  Will replace K+.      Intervention Category Major Interventions: Electrolyte abnormality - evaluation and management  Casimir Barcellos Eugene 04/29/2019, 10:36 PM

## 2019-04-29 NOTE — Progress Notes (Signed)
LTM maint complete - no skin breakdown under:  Fp1, F7, F3, A1 (slight redness). Fixed P3, T7, T8, C3, F4, O1

## 2019-04-29 NOTE — Progress Notes (Signed)
eLink Physician-Brief Progress Note Patient Name: Andrew Christensen DOB: 05-04-1995 MRN: [id number]   Date of Service  04/29/2019  HPI/Events of Note  Notified of hypotension SBP 80s. Likely medication induced as patient Propofol 70 and Versed 10. He is receiving about 30 cc /hr of drip and tube feeding with adequate urine output of approximately 150 cc/hr  eICU Interventions  Ordered to start norepinephrine        Irving Burton T Thailyn Khalid 04/29/2019, 12:53 AM

## 2019-04-29 NOTE — Procedures (Addendum)
Patient Name: Andrew Christensen  MRN: [id number]  Epilepsy Attending: Charlsie Quest  Referring Physician/Provider: Valentina Lucks, NP Duration: 04/28/2019 1157 to 04/29/2019 1157  Patient history: 23yo M found down and noted to have myoclonic jerks. EEG to evaluate for seizure  Level of alertness: comatose  AEDs during EEG study: Versed, propofol, keppra, vpa  Technical aspects: This EEG study was done with scalp electrodes positioned according to the 10-20 International system of electrode placement. Electrical activity was acquired at a sampling rate of 500Hz  and reviewed with a high frequency filter of 70Hz  and a low frequency filter of 1Hz . EEG data were recorded continuously and digitally stored.   DESCRIPTION:  At the beginning of study, frequent episodes of axial jerk with eye opening were noted. Concomitant EEG showed generalized high amplitude polyspikes consistent with status myoclonus, This improved by around 1230 on 04/28/2019. However, around 1700, frequent myoclonic seizures were noted again which imporved after 2100 on 04/28/2019. As sedation was increased, generalized polyspikes appeared less sharply contoured and lasted for 1-2 seconds only and eventually evolving into generalized maximal bifrontal periodic epileptiform discharges at 0.5-1Hz  after around 5am on 04/29/2019. After around 7am, eeg shows generalized background suppression with sharp waves in left frontotemporal region.  Hyperventilation and photic stimulation were not performed.  ABNORMALITY - Status myoclonus, generalized - Periodic epileptiform discharges, generalized - Sharp waves, left fronto-temporal region - Background suppression, generalized  IMPRESSION: This study initially showed evidence of status myoclonus with generalized onset. As sedation was increased, status myoclonus improved but eeg continued to show evidence of generalized epileptogenicity as well as profound diffuse encephalopathy, non  specific to etiology.   Dr Aroor was intermittently notified.      Andrew Christensen [phone]

## 2019-04-29 NOTE — Progress Notes (Signed)
NAME:  Andrew Christensen, MRN:  [id number], DOB:  1995/10/20, LOS: 2 ADMISSION DATE:  05-08-2019, CONSULTATION DATE:  04/29/19 REFERRING MD: Durene Cal  , CHIEF COMPLAINT:  Cardiac Arrest   Brief History   24 year old male with a history of polysubstance abuse who was found unresponsive at home, status post 8 minutes of CPR by EMS with ROSC.  Intubated and PCCM asked to admit  History of present illness   24 year old male with a past medical history of polysubstance abuse who is coming from home after being found unresponsive.  His father is at the bedside and states that they work together all day, he seemed happy and to be doing well, he dropped him off at home around 5:55 PM and went back out again.  He is unsure of the timing, but thinks he came back home about 40 minutes later and found his son naked and curled up in the corner of the bathroom unresponsive and not breathing; states he was blue around the mouth.  His father immediately called 911 and started CPR, on EMS arrival patient was in asystole.  CPR for approximately 8 minutes with 1 epi given and then ROSC with sinus tach.  Patient was intubated and transported to the emergency department.  CT head shows diffuse loss of sulcal markings, no definite edema.  CXR clear, but CT cervical spine with confluent bilateral pulmonary opacities in the visualized lung apices concerning for multifocal pneumonia.  UDS positive for heroine and amphetamines.  Lactic acid up-trending from 3.9 to 8.3.  He was given fentanyl and Versed in the ED, has had some myoclonus, and breathing over the vent, but otherwise not responsive.  He is prescribed Adderall, but his father has been concerned that he might return to snorting heroin as he has in the past   Patient with was admitted in January 2021 for psychosis and polysubstance abuse (cannabis, K2, Adderall) and was discharged home at that time.  His father notes no recent suicidal ideation or obvious depression though  previous psych note states: "His father reports that his mother was addicted to numerous opiates in her lifetime and actually died 3 years ago from complications from opiate dependency but states that she actually gave Andrew Christensen pills in the sixth grade, he was actually taking and selling Percocet at that age."   Past Medical History  Polysubstance abuse, ADD  Significant Hospital Events   3/5 admit to PCCM  Consults:    Procedures:  3/5 ETT  Significant Diagnostic Tests:  3/5 CT head>>Mild diffuse loss of sulcal markings when compared to the prior study dated March 18, 2019, without definite evidence of diffuse cerebral edema. MRI correlation is recommended. 3/5 CT C-spine>>Confluent bilateral pulmonary opacities concerning for multifocal pneumonia. Clinical correlation is recommended. 3/6 MRI: Probable early changes of hypoxic ischemic injury, manifest as abnormal early restricted diffusion and T2 signal within the caudate and putamen portion of the basal ganglia and probably affecting the diffuse cortical brain.   Micro Data:  3/5 SARS-Cov-2>> negative 3/5 blood cultures x2>>  Antimicrobials:  Zosyn 3/5-  Interim history/subjective:  3/7: still in status on increased sedation. req pressors overnight and this am for hypotension most likely 2/2 sedation. Spoke with father at length as well as pt's support system who have clearly stated that should "scotty" not have ability to return to a fairly independent state he would not want to continue living. They would not want prolonged life support unless that was possible. Of course they want  to give him every possible chance to recover but are clear of his wishes.  3/6: mri with probable anoxic injury. Neuro consult pending.   Objective   Blood pressure (!) 106/59, pulse 87, temperature (!) 97.3 F (36.3 C), temperature source Axillary, resp. rate 18, weight 105.1 kg, SpO2 98 %.    Vent Mode: PRVC FiO2 (%):  [40 %] 40 % Set Rate:   [18 bmp] 18 bmp Vt Set:  [650 mL] 650 mL PEEP:  [5 cmH20] 5 cmH20 Plateau Pressure:  [15 cmH20-19 cmH20] 16 cmH20   Intake/Output Summary (Last 24 hours) at 04/29/2019 1028 Last data filed at 04/29/2019 0600 Gross per 24 hour  Intake 1991.77 ml  Output 2800 ml  Net -808.23 ml   Filed Weights   05/19/2019 2025 04/28/19 0500 04/29/19 0500  Weight: 97.5 kg 97.5 kg 105.1 kg    General: Well-nourished male intubated and sedated HEENT: MM pink/moist, sclera injected Neuro: no clinical sz activity, pt with eyes deviated upward pupils pinpoint no corneal.  CV: s1s2 RRR, no m/r/g PULM: ET tube in place, CTAB GI: soft, bsx4 active  Extremities: warm/dry, no edema  Skin: no rashes or lesions   Resolved Hospital Problem list   none  Assessment & Plan:   PEA arrest s/p ROSC with subsequent hypoxic respiratory failure and intubation -not a candidate for cooling due to unknown down time -P: -Continue full ventilatory support -Propofol.versed ongoing -on LTM with status reported -mri with early hypoxic injury seen -appreciate neuro recs -cont keppra  -Maintain full vent support with SAT/SBT as tolerated -titrate Vent setting to maintain SpO2 greater than or equal to 90%. -HOB elevated 30 degrees -Plateau pressures less than 30 cm H20.  -Bronchial hygiene and RT/bronchodilator protocol. -Guarded prognosis given unknown downtime Hypotension:  -req pressors  -titrate to map >65 -liekly 2/2 sedation rather than acute infection Acute encephalopathy Status myoclonus:  2/2 above -LTM continues -neuro consult appreciated -keppra -propofol/versed for status  Likely aspiration pneumonia -Bilateral upper lobe infiltrates on CT -Covid negative P: -blood cx ngtd -cont zosyn for now.  -cxr in am  ARF  P:  -improving, uop adequate -adequate uop -cr 1.5->0.9 -labs pending   Hyperglycemia -no history of DM P: -a1c 4.6 - treat with SSI   Elevated Transaminases -likely early  shock liver P: -downtrending -repeat labs pending   Polysubstance abuse -Most suspicious of heroin overdose P: -If patient recovers will need psychiatric support  Goals of care:  -Spoke with father at length as well as pt's support system who have clearly stated that should "scotty" not have ability to return to a fairly independent state he would not want to continue living. They would not want prolonged life support unless that was possible. Of course they want to give him every possible chance to recover but are clear of his wishes.       Best practice:  Diet: N.p.o. starting tf Pain/Anxiety/Delirium protocol (if indicated): Propofol/versed VAP protocol (if indicated): Head of the bed 30 degrees, daily SBT DVT prophylaxis: Lovenox GI prophylaxis: Pepcid Glucose control: SSI Mobility: Bedrest Code Status: Full code Family Communication: father via phone 3/7 Disposition: ICU  Labs   CBC: Recent Labs  Lab 05/01/2019 1925 04/26/2019 1953 04/28/19 0018 04/28/19 0648  WBC 13.7*  --   --  24.6*  NEUTROABS 7.4  --   --   --   HGB 14.7 15.0 15.6 16.3  HCT 44.9 44.0 46.0 47.3  MCV 100.4*  --   --  94.0  PLT 319  --   --  315    Basic Metabolic Panel: Recent Labs  Lab 05/03/19 1925 May 03, 2019 1953 04/28/19 0018 04/28/19 0648 04/28/19 1217 04/28/19 1654 04/29/19 0548  NA 133* 133* 138 140  --   --   --   K 4.5 4.2 3.6 4.4  --   --   --   CL 99  --   --  104  --   --   --   CO2 19*  --   --  22  --   --   --   GLUCOSE 382*  --   --  102*  --   --   --   BUN 17  --   --  15  --   --   --   CREATININE 1.50*  --   --  0.98  --   --   --   CALCIUM 8.6*  --   --  9.1  --   --   --   MG  --   --   --  2.0 1.9 2.0 2.1  PHOS  --   --   --  3.4 2.6 3.6 2.1*   GFR: CrCl cannot be calculated (Unknown ideal weight.). Recent Labs  Lab 05/03/19 1925 03-May-2019 2125 04/28/19 0648  WBC 13.7*  --  24.6*  LATICACIDVEN 8.3* 3.9*  --     Liver Function Tests: Recent Labs    Lab 03-May-2019 1925 04/28/19 0648  AST 122* 81*  ALT 134* 133*  ALKPHOS 87 85  BILITOT 0.5 0.7  PROT 5.9* 6.3*  ALBUMIN 3.7 3.9   No results for input(s): LIPASE, AMYLASE in the last 168 hours. Recent Labs  Lab 05/03/2019 1939 04/28/19 1217  AMMONIA 75* 27    ABG    Component Value Date/Time   PHART 7.336 (L) 04/28/2019 0018   PCO2ART 42.1 04/28/2019 0018   PO2ART 110.0 (H) 04/28/2019 0018   HCO3 22.6 04/28/2019 0018   TCO2 24 04/28/2019 0018   ACIDBASEDEF 3.0 (H) 04/28/2019 0018   O2SAT 98.0 04/28/2019 0018     Coagulation Profile: Recent Labs  Lab 2019/05/03 1925  INR 1.1    Cardiac Enzymes: No results for input(s): CKTOTAL, CKMB, CKMBINDEX, TROPONINI in the last 168 hours.  HbA1C: Hgb A1c MFr Bld  Date/Time Value Ref Range Status  04/28/2019 12:22 AM 4.6 (L) 4.8 - 5.6 % Final    Comment:    (NOTE) Pre diabetes:          5.7%-6.4% Diabetes:              >6.4% Glycemic control for   <7.0% adults with diabetes     CBG: Recent Labs  Lab 04/28/19 1953 04/28/19 2315 04/29/19 0328 04/29/19 0808 04/29/19 1003  GLUCAP 126* 103* 85 69* 86     Critical care time: The patient is critically ill with multiple organ systems failure and requires high complexity decision making for assessment and support, frequent evaluation and titration of therapies, application of advanced monitoring technologies and extensive interpretation of multiple databases.  Critical care time 37 mins. This represents my time independent of the NPs time taking care of the pt. This is excluding procedures.    [facility] 04/29/2019, 10:28 AM

## 2019-04-30 ENCOUNTER — Inpatient Hospital Stay (HOSPITAL_COMMUNITY): Payer: Medicaid Other

## 2019-04-30 ENCOUNTER — Other Ambulatory Visit (HOSPITAL_COMMUNITY): Payer: Medicaid Other

## 2019-04-30 DIAGNOSIS — I469 Cardiac arrest, cause unspecified: Secondary | ICD-10-CM | POA: Diagnosis not present

## 2019-04-30 DIAGNOSIS — J9601 Acute respiratory failure with hypoxia: Secondary | ICD-10-CM | POA: Diagnosis not present

## 2019-04-30 LAB — CBC
HCT: 41.9 % (ref 39.0–52.0)
Hemoglobin: 14.2 g/dL (ref 13.0–17.0)
MCH: 32.3 pg (ref 26.0–34.0)
MCHC: 33.9 g/dL (ref 30.0–36.0)
MCV: 95.4 fL (ref 80.0–100.0)
Platelets: 279 10*3/uL (ref 150–400)
RBC: 4.39 MIL/uL (ref 4.22–5.81)
RDW: 13.7 % (ref 11.5–15.5)
WBC: 9.5 10*3/uL (ref 4.0–10.5)
nRBC: 0 % (ref 0.0–0.2)

## 2019-04-30 LAB — POCT I-STAT 7, (LYTES, BLD GAS, ICA,H+H)
Acid-Base Excess: 1 mmol/L (ref 0.0–2.0)
Acid-base deficit: 1 mmol/L (ref 0.0–2.0)
Bicarbonate: 24 mmol/L (ref 20.0–28.0)
Bicarbonate: 27.7 mmol/L (ref 20.0–28.0)
Bicarbonate: 22.6 mmol/L (ref 20.0–28.0)
Bicarbonate: 24.1 mmol/L (ref 20.0–28.0)
Calcium, Ion: 1.29 mmol/L (ref 1.15–1.40)
Calcium, Ion: 1.35 mmol/L (ref 1.15–1.40)
Calcium, Ion: 1.3 mmol/L (ref 1.15–1.40)
Calcium, Ion: 1.31 mmol/L (ref 1.15–1.40)
HCT: 41 % (ref 39.0–52.0)
HCT: 42 % (ref 39.0–52.0)
HCT: 40 % (ref 39.0–52.0)
HCT: 39 % (ref 39.0–52.0)
Hemoglobin: 13.9 g/dL (ref 13.0–17.0)
Hemoglobin: 14.3 g/dL (ref 13.0–17.0)
Hemoglobin: 13.6 g/dL (ref 13.0–17.0)
Hemoglobin: 13.3 g/dL (ref 13.0–17.0)
O2 Saturation: 89 %
O2 Saturation: 96 %
O2 Saturation: 94 %
O2 Saturation: 99 %
Patient temperature: 93.3
Patient temperature: 94
Potassium: 3.5 mmol/L (ref 3.5–5.1)
Potassium: 3.6 mmol/L (ref 3.5–5.1)
Potassium: 3.4 mmol/L — ABNORMAL LOW (ref 3.5–5.1)
Potassium: 3.5 mmol/L (ref 3.5–5.1)
Sodium: 157 mmol/L — ABNORMAL HIGH (ref 135–145)
Sodium: 158 mmol/L — ABNORMAL HIGH (ref 135–145)
Sodium: 157 mmol/L — ABNORMAL HIGH (ref 135–145)
Sodium: 156 mmol/L — ABNORMAL HIGH (ref 135–145)
TCO2: 25 mmol/L (ref 22–32)
TCO2: 29 mmol/L (ref 22–32)
TCO2: 24 mmol/L (ref 22–32)
TCO2: 25 mmol/L (ref 22–32)
pCO2 arterial: 33 mmHg (ref 32.0–48.0)
pCO2 arterial: 59 mmHg — ABNORMAL HIGH (ref 32.0–48.0)
pCO2 arterial: 29 mmHg — ABNORMAL LOW (ref 32.0–48.0)
pCO2 arterial: 32.5 mmHg (ref 32.0–48.0)
pH, Arterial: 7.28 — ABNORMAL LOW (ref 7.350–7.450)
pH, Arterial: 7.47 — ABNORMAL HIGH (ref 7.350–7.450)
pH, Arterial: 7.487 — ABNORMAL HIGH (ref 7.350–7.450)
pH, Arterial: 7.467 — ABNORMAL HIGH (ref 7.350–7.450)
pO2, Arterial: 65 mmHg — ABNORMAL LOW (ref 83.0–108.0)
pO2, Arterial: 75 mmHg — ABNORMAL LOW (ref 83.0–108.0)
pO2, Arterial: 54 mmHg — ABNORMAL LOW (ref 83.0–108.0)
pO2, Arterial: 132 mmHg — ABNORMAL HIGH (ref 83.0–108.0)

## 2019-04-30 LAB — COMPREHENSIVE METABOLIC PANEL
ALT: 63 U/L — ABNORMAL HIGH (ref 0–44)
AST: 65 U/L — ABNORMAL HIGH (ref 15–41)
Albumin: 2.8 g/dL — ABNORMAL LOW (ref 3.5–5.0)
Alkaline Phosphatase: 64 U/L (ref 38–126)
Anion gap: 10 (ref 5–15)
BUN: 10 mg/dL (ref 6–20)
CO2: 22 mmol/L (ref 22–32)
Calcium: 9.2 mg/dL (ref 8.9–10.3)
Chloride: 120 mmol/L — ABNORMAL HIGH (ref 98–111)
Creatinine, Ser: 0.74 mg/dL (ref 0.61–1.24)
GFR calc Af Amer: >60 mL/min (ref >60)
GFR calc non Af Amer: >60 mL/min (ref >60)
Glucose, Bld: 120 mg/dL — ABNORMAL HIGH (ref 70–99)
Potassium: 4.2 mmol/L (ref 3.5–5.1)
Sodium: 152 mmol/L — ABNORMAL HIGH (ref 135–145)
Total Bilirubin: 0.4 mg/dL (ref 0.3–1.2)
Total Protein: 5.5 g/dL — ABNORMAL LOW (ref 6.5–8.1)

## 2019-04-30 LAB — ECHOCARDIOGRAM COMPLETE: Weight: 3710.78 oz

## 2019-04-30 LAB — BASIC METABOLIC PANEL
Anion gap: 10 (ref 5–15)
Anion gap: 7 (ref 5–15)
BUN: 10 mg/dL (ref 6–20)
BUN: 10 mg/dL (ref 6–20)
CO2: 23 mmol/L (ref 22–32)
CO2: 22 mmol/L (ref 22–32)
Calcium: 8.9 mg/dL (ref 8.9–10.3)
Calcium: 9.4 mg/dL (ref 8.9–10.3)
Chloride: 112 mmol/L — ABNORMAL HIGH (ref 98–111)
Chloride: 126 mmol/L — ABNORMAL HIGH (ref 98–111)
Creatinine, Ser: 0.59 mg/dL — ABNORMAL LOW (ref 0.61–1.24)
Creatinine, Ser: 0.71 mg/dL (ref 0.61–1.24)
GFR calc Af Amer: >60 mL/min (ref >60)
GFR calc Af Amer: >60 mL/min (ref >60)
GFR calc non Af Amer: >60 mL/min (ref >60)
GFR calc non Af Amer: >60 mL/min (ref >60)
Glucose, Bld: 101 mg/dL — ABNORMAL HIGH (ref 70–99)
Glucose, Bld: 126 mg/dL — ABNORMAL HIGH (ref 70–99)
Potassium: 4.1 mmol/L (ref 3.5–5.1)
Potassium: 4.3 mmol/L (ref 3.5–5.1)
Sodium: 145 mmol/L (ref 135–145)
Sodium: 155 mmol/L — ABNORMAL HIGH (ref 135–145)

## 2019-04-30 LAB — OSMOLALITY: Osmolality: 303 mOsm/kg — ABNORMAL HIGH (ref 275–295)

## 2019-04-30 LAB — GLUCOSE, CAPILLARY
Glucose-Capillary: 107 mg/dL — ABNORMAL HIGH (ref 70–99)
Glucose-Capillary: 108 mg/dL — ABNORMAL HIGH (ref 70–99)
Glucose-Capillary: 82 mg/dL (ref 70–99)
Glucose-Capillary: 86 mg/dL (ref 70–99)
Glucose-Capillary: 88 mg/dL (ref 70–99)
Glucose-Capillary: 96 mg/dL (ref 70–99)

## 2019-04-30 LAB — UREA NITROGEN, URINE: Urea Nitrogen, Ur: 405 mg/dL

## 2019-04-30 LAB — OSMOLALITY, URINE: Osmolality, Ur: 88 mOsm/kg — ABNORMAL LOW (ref 300–900)

## 2019-04-30 LAB — TRIGLYCERIDES: Triglycerides: 118 mg/dL (ref <150)

## 2019-04-30 MED ORDER — SODIUM CHLORIDE 0.9 % IV SOLN: INTRAVENOUS | Status: DC | PRN

## 2019-04-30 MED ORDER — SODIUM CHLORIDE 0.45 % IV SOLN: INTRAVENOUS | Status: DC

## 2019-04-30 MED ORDER — CHLORHEXIDINE GLUCONATE CLOTH 2 % EX PADS: 6.0000 | MEDICATED_PAD | Freq: Every day | CUTANEOUS | Status: DC

## 2019-04-30 MED ORDER — SODIUM CHLORIDE 0.9% FLUSH: 10.0000 mL | Freq: Two times a day (BID) | INTRAVENOUS | Status: DC

## 2019-04-30 MED ORDER — ARTIFICIAL TEARS OPHTHALMIC OINT
TOPICAL_OINTMENT | Freq: Three times a day (TID) | OPHTHALMIC | Status: DC
  Administered 2019-04-30 – 2019-05-01 (×3): 1 via OPHTHALMIC
  Filled 2019-04-30: qty 3.5

## 2019-04-30 MED ORDER — SODIUM CHLORIDE 0.9 % IV SOLN
1.5000 g | Freq: Three times a day (TID) | INTRAVENOUS | Status: DC
  Administered 2019-04-30 – 2019-05-01 (×5): 1.5 g via INTRAVENOUS
  Filled 2019-04-30: qty 1.5
  Filled 2019-04-30: qty 4
  Filled 2019-04-30: qty 1.5
  Filled 2019-04-30 (×2): qty 4
  Filled 2019-04-30 (×2): qty 1.5
  Filled 2019-04-30: qty 4

## 2019-04-30 MED ORDER — SODIUM CHLORIDE 0.9 % IV SOLN
1000.0000 mg | INTRAVENOUS | Status: AC
  Administered 2019-05-01: 1000 mg via INTRAVENOUS
  Filled 2019-04-30: qty 8

## 2019-04-30 MED ORDER — DESMOPRESSIN ACETATE 4 MCG/ML IJ SOLN
4.0000 ug | Freq: Once | INTRAMUSCULAR | Status: AC
  Administered 2019-04-30: 4 ug via INTRAVENOUS
  Filled 2019-04-30: qty 1

## 2019-04-30 MED ORDER — VITAL AF 1.2 CAL PO LIQD: 1000.0000 mL | ORAL | Status: DC

## 2019-04-30 MED ORDER — SODIUM CHLORIDE 0.9% FLUSH: 10.0000 mL | INTRAVENOUS | Status: DC | PRN

## 2019-04-30 MED ORDER — CHLORHEXIDINE GLUCONATE CLOTH 2 % EX PADS
6.0000 | MEDICATED_PAD | Freq: Every day | CUTANEOUS | Status: DC
  Administered 2019-05-01: 6 via TOPICAL

## 2019-04-30 MED ORDER — VASOPRESSIN 20 UNIT/ML IV SOLN
0.4000 [IU]/h | INTRAVENOUS | Status: DC
  Administered 2019-05-01: 0.4 [IU]/h via INTRAVENOUS
  Filled 2019-04-30 (×2): qty 1

## 2019-04-30 MED ORDER — VANCOMYCIN HCL 1500 MG/300ML IV SOLN
1500.0000 mg | INTRAVENOUS | Status: AC
  Administered 2019-04-30: 1500 mg via INTRAVENOUS
  Filled 2019-04-30: qty 300

## 2019-05-01 ENCOUNTER — Other Ambulatory Visit (HOSPITAL_COMMUNITY): Payer: Medicaid Other

## 2019-05-01 LAB — POCT I-STAT 7, (LYTES, BLD GAS, ICA,H+H)
Acid-Base Excess: 2 mmol/L (ref 0.0–2.0)
Acid-Base Excess: 1 mmol/L (ref 0.0–2.0)
Acid-Base Excess: 2 mmol/L (ref 0.0–2.0)
Acid-Base Excess: 3 mmol/L — ABNORMAL HIGH (ref 0.0–2.0)
Acid-base deficit: 1 mmol/L (ref 0.0–2.0)
Acid-base deficit: 1 mmol/L (ref 0.0–2.0)
Bicarbonate: 22.9 mmol/L (ref 20.0–28.0)
Bicarbonate: 23.4 mmol/L (ref 20.0–28.0)
Bicarbonate: 24 mmol/L (ref 20.0–28.0)
Bicarbonate: 24.3 mmol/L (ref 20.0–28.0)
Bicarbonate: 25.8 mmol/L (ref 20.0–28.0)
Bicarbonate: 26.5 mmol/L (ref 20.0–28.0)
Bicarbonate: 26.8 mmol/L (ref 20.0–28.0)
Calcium, Ion: 1.23 mmol/L (ref 1.15–1.40)
Calcium, Ion: 1.24 mmol/L (ref 1.15–1.40)
Calcium, Ion: 1.24 mmol/L (ref 1.15–1.40)
Calcium, Ion: 1.27 mmol/L (ref 1.15–1.40)
Calcium, Ion: 1.27 mmol/L (ref 1.15–1.40)
Calcium, Ion: 1.28 mmol/L (ref 1.15–1.40)
Calcium, Ion: 1.3 mmol/L (ref 1.15–1.40)
HCT: 34 % — ABNORMAL LOW (ref 39.0–52.0)
HCT: 34 % — ABNORMAL LOW (ref 39.0–52.0)
HCT: 35 % — ABNORMAL LOW (ref 39.0–52.0)
HCT: 35 % — ABNORMAL LOW (ref 39.0–52.0)
HCT: 35 % — ABNORMAL LOW (ref 39.0–52.0)
HCT: 37 % — ABNORMAL LOW (ref 39.0–52.0)
HCT: 39 % (ref 39.0–52.0)
Hemoglobin: 11.6 g/dL — ABNORMAL LOW (ref 13.0–17.0)
Hemoglobin: 11.6 g/dL — ABNORMAL LOW (ref 13.0–17.0)
Hemoglobin: 11.9 g/dL — ABNORMAL LOW (ref 13.0–17.0)
Hemoglobin: 11.9 g/dL — ABNORMAL LOW (ref 13.0–17.0)
Hemoglobin: 11.9 g/dL — ABNORMAL LOW (ref 13.0–17.0)
Hemoglobin: 12.6 g/dL — ABNORMAL LOW (ref 13.0–17.0)
Hemoglobin: 13.3 g/dL (ref 13.0–17.0)
O2 Saturation: 100 %
O2 Saturation: 100 %
O2 Saturation: 100 %
O2 Saturation: 100 %
O2 Saturation: 100 %
O2 Saturation: 99 %
O2 Saturation: 99 %
Patient temperature: 35.3
Patient temperature: 37.5
Patient temperature: 98
Patient temperature: 99
Potassium: 3.1 mmol/L — ABNORMAL LOW (ref 3.5–5.1)
Potassium: 3.1 mmol/L — ABNORMAL LOW (ref 3.5–5.1)
Potassium: 3.2 mmol/L — ABNORMAL LOW (ref 3.5–5.1)
Potassium: 3.2 mmol/L — ABNORMAL LOW (ref 3.5–5.1)
Potassium: 3.3 mmol/L — ABNORMAL LOW (ref 3.5–5.1)
Potassium: 3.3 mmol/L — ABNORMAL LOW (ref 3.5–5.1)
Potassium: 3.6 mmol/L (ref 3.5–5.1)
Sodium: 155 mmol/L — ABNORMAL HIGH (ref 135–145)
Sodium: 156 mmol/L — ABNORMAL HIGH (ref 135–145)
Sodium: 158 mmol/L — ABNORMAL HIGH (ref 135–145)
Sodium: 158 mmol/L — ABNORMAL HIGH (ref 135–145)
Sodium: 160 mmol/L — ABNORMAL HIGH (ref 135–145)
Sodium: 160 mmol/L — ABNORMAL HIGH (ref 135–145)
Sodium: 161 mmol/L (ref 135–145)
TCO2: 24 mmol/L (ref 22–32)
TCO2: 24 mmol/L (ref 22–32)
TCO2: 25 mmol/L (ref 22–32)
TCO2: 25 mmol/L (ref 22–32)
TCO2: 27 mmol/L (ref 22–32)
TCO2: 28 mmol/L (ref 22–32)
TCO2: 28 mmol/L (ref 22–32)
pCO2 arterial: 33.2 mmHg (ref 32.0–48.0)
pCO2 arterial: 33.5 mmHg (ref 32.0–48.0)
pCO2 arterial: 34.3 mmHg (ref 32.0–48.0)
pCO2 arterial: 35.3 mmHg (ref 32.0–48.0)
pCO2 arterial: 36.9 mmHg (ref 32.0–48.0)
pCO2 arterial: 37.2 mmHg (ref 32.0–48.0)
pCO2 arterial: 39.1 mmHg (ref 32.0–48.0)
pH, Arterial: 7.412 (ref 7.350–7.450)
pH, Arterial: 7.433 (ref 7.350–7.450)
pH, Arterial: 7.438 (ref 7.350–7.450)
pH, Arterial: 7.461 — ABNORMAL HIGH (ref 7.350–7.450)
pH, Arterial: 7.465 — ABNORMAL HIGH (ref 7.350–7.450)
pH, Arterial: 7.467 — ABNORMAL HIGH (ref 7.350–7.450)
pH, Arterial: 7.471 — ABNORMAL HIGH (ref 7.350–7.450)
pO2, Arterial: 128 mmHg — ABNORMAL HIGH (ref 83.0–108.0)
pO2, Arterial: 145 mmHg — ABNORMAL HIGH (ref 83.0–108.0)
pO2, Arterial: 184 mmHg — ABNORMAL HIGH (ref 83.0–108.0)
pO2, Arterial: 231 mmHg — ABNORMAL HIGH (ref 83.0–108.0)
pO2, Arterial: 315 mmHg — ABNORMAL HIGH (ref 83.0–108.0)
pO2, Arterial: 398 mmHg — ABNORMAL HIGH (ref 83.0–108.0)
pO2, Arterial: 435 mmHg — ABNORMAL HIGH (ref 83.0–108.0)

## 2019-05-01 LAB — COMPREHENSIVE METABOLIC PANEL
ALT: 64 U/L — ABNORMAL HIGH (ref 0–44)
ALT: 64 U/L — ABNORMAL HIGH (ref 0–44)
AST: 87 U/L — ABNORMAL HIGH (ref 15–41)
AST: 76 U/L — ABNORMAL HIGH (ref 15–41)
Albumin: 2.7 g/dL — ABNORMAL LOW (ref 3.5–5.0)
Albumin: 2.9 g/dL — ABNORMAL LOW (ref 3.5–5.0)
Alkaline Phosphatase: 62 U/L (ref 38–126)
Alkaline Phosphatase: 60 U/L (ref 38–126)
Anion gap: 6 (ref 5–15)
Anion gap: 10 (ref 5–15)
BUN: 11 mg/dL (ref 6–20)
BUN: 7 mg/dL (ref 6–20)
CO2: 23 mmol/L (ref 22–32)
CO2: 23 mmol/L (ref 22–32)
Calcium: 8.6 mg/dL — ABNORMAL LOW (ref 8.9–10.3)
Calcium: 9.1 mg/dL (ref 8.9–10.3)
Chloride: 127 mmol/L — ABNORMAL HIGH (ref 98–111)
Chloride: 129 mmol/L — ABNORMAL HIGH (ref 98–111)
Creatinine, Ser: 0.71 mg/dL (ref 0.61–1.24)
Creatinine, Ser: 0.97 mg/dL (ref 0.61–1.24)
GFR calc Af Amer: >60 mL/min (ref >60)
GFR calc Af Amer: >60 mL/min (ref >60)
GFR calc non Af Amer: >60 mL/min (ref >60)
GFR calc non Af Amer: >60 mL/min (ref >60)
Glucose, Bld: 88 mg/dL (ref 70–99)
Glucose, Bld: 194 mg/dL — ABNORMAL HIGH (ref 70–99)
Potassium: 3.5 mmol/L (ref 3.5–5.1)
Potassium: 3.8 mmol/L (ref 3.5–5.1)
Sodium: 156 mmol/L — ABNORMAL HIGH (ref 135–145)
Sodium: 162 mmol/L (ref 135–145)
Total Bilirubin: 0.7 mg/dL (ref 0.3–1.2)
Total Bilirubin: 0.6 mg/dL (ref 0.3–1.2)
Total Protein: 5.5 g/dL — ABNORMAL LOW (ref 6.5–8.1)
Total Protein: 5.7 g/dL — ABNORMAL LOW (ref 6.5–8.1)

## 2019-05-01 LAB — RESPIRATORY PANEL BY RT PCR (FLU A&B, COVID)
Influenza A by PCR: NEGATIVE
Influenza B by PCR: NEGATIVE
SARS Coronavirus 2 by RT PCR: NEGATIVE

## 2019-05-01 LAB — CBC
HCT: 43.7 % (ref 39.0–52.0)
Hemoglobin: 14.3 g/dL (ref 13.0–17.0)
MCH: 32.4 pg (ref 26.0–34.0)
MCHC: 32.7 g/dL (ref 30.0–36.0)
MCV: 99.1 fL (ref 80.0–100.0)
Platelets: 258 10*3/uL (ref 150–400)
RBC: 4.41 MIL/uL (ref 4.22–5.81)
RDW: 14.1 % (ref 11.5–15.5)
WBC: 9.1 10*3/uL (ref 4.0–10.5)
nRBC: 0 % (ref 0.0–0.2)

## 2019-05-01 LAB — DIFFERENTIAL
Abs Immature Granulocytes: 0.04 10*3/uL (ref 0.00–0.07)
Basophils Absolute: 0 10*3/uL (ref 0.0–0.1)
Basophils Relative: 0 %
Eosinophils Absolute: 0.1 10*3/uL (ref 0.0–0.5)
Eosinophils Relative: 1 %
Immature Granulocytes: 0 %
Lymphocytes Relative: 17 %
Lymphs Abs: 1.6 10*3/uL (ref 0.7–4.0)
Monocytes Absolute: 0.3 10*3/uL (ref 0.1–1.0)
Monocytes Relative: 3 %
Neutro Abs: 7.2 10*3/uL (ref 1.7–7.7)
Neutrophils Relative %: 79 %

## 2019-05-01 LAB — BASIC METABOLIC PANEL
Anion gap: 9 (ref 5–15)
Anion gap: 10 (ref 5–15)
BUN: 9 mg/dL (ref 6–20)
BUN: 7 mg/dL (ref 6–20)
CO2: 21 mmol/L — ABNORMAL LOW (ref 22–32)
CO2: 21 mmol/L — ABNORMAL LOW (ref 22–32)
Calcium: 8.8 mg/dL — ABNORMAL LOW (ref 8.9–10.3)
Calcium: 8.3 mg/dL — ABNORMAL LOW (ref 8.9–10.3)
Chloride: 129 mmol/L — ABNORMAL HIGH (ref 98–111)
Chloride: 124 mmol/L — ABNORMAL HIGH (ref 98–111)
Creatinine, Ser: 0.78 mg/dL (ref 0.61–1.24)
Creatinine, Ser: 0.74 mg/dL (ref 0.61–1.24)
GFR calc Af Amer: >60 mL/min (ref >60)
GFR calc Af Amer: >60 mL/min (ref >60)
GFR calc non Af Amer: >60 mL/min (ref >60)
GFR calc non Af Amer: >60 mL/min (ref >60)
Glucose, Bld: 110 mg/dL — ABNORMAL HIGH (ref 70–99)
Glucose, Bld: 154 mg/dL — ABNORMAL HIGH (ref 70–99)
Potassium: 3.5 mmol/L (ref 3.5–5.1)
Potassium: 3.1 mmol/L — ABNORMAL LOW (ref 3.5–5.1)
Sodium: 159 mmol/L — ABNORMAL HIGH (ref 135–145)
Sodium: 155 mmol/L — ABNORMAL HIGH (ref 135–145)

## 2019-05-01 LAB — MAGNESIUM
Magnesium: 2.1 mg/dL (ref 1.7–2.4)
Magnesium: 2.1 mg/dL (ref 1.7–2.4)

## 2019-05-01 LAB — APTT: aPTT: 28 seconds (ref 24–36)

## 2019-05-01 LAB — TYPE AND SCREEN
ABO/RH(D): O POS
Antibody Screen: NEGATIVE

## 2019-05-01 LAB — URINALYSIS, COMPLETE (UACMP) WITH MICROSCOPIC
Bacteria, UA: NONE SEEN
Bilirubin Urine: NEGATIVE
Glucose, UA: NEGATIVE mg/dL
Hgb urine dipstick: NEGATIVE
Ketones, ur: NEGATIVE mg/dL
Leukocytes,Ua: NEGATIVE
Nitrite: NEGATIVE
Protein, ur: NEGATIVE mg/dL
Specific Gravity, Urine: 1.017 (ref 1.005–1.030)
pH: 6 (ref 5.0–8.0)

## 2019-05-01 LAB — GLUCOSE, CAPILLARY
Glucose-Capillary: 104 mg/dL — ABNORMAL HIGH (ref 70–99)
Glucose-Capillary: 120 mg/dL — ABNORMAL HIGH (ref 70–99)
Glucose-Capillary: 130 mg/dL — ABNORMAL HIGH (ref 70–99)
Glucose-Capillary: 141 mg/dL — ABNORMAL HIGH (ref 70–99)
Glucose-Capillary: 150 mg/dL — ABNORMAL HIGH (ref 70–99)
Glucose-Capillary: 151 mg/dL — ABNORMAL HIGH (ref 70–99)
Glucose-Capillary: 154 mg/dL — ABNORMAL HIGH (ref 70–99)
Glucose-Capillary: 162 mg/dL — ABNORMAL HIGH (ref 70–99)
Glucose-Capillary: 165 mg/dL — ABNORMAL HIGH (ref 70–99)
Glucose-Capillary: 169 mg/dL — ABNORMAL HIGH (ref 70–99)
Glucose-Capillary: 76 mg/dL (ref 70–99)
Glucose-Capillary: 84 mg/dL (ref 70–99)

## 2019-05-01 LAB — PHOSPHORUS
Phosphorus: 2.7 mg/dL (ref 2.5–4.6)
Phosphorus: 2.3 mg/dL — ABNORMAL LOW (ref 2.5–4.6)

## 2019-05-01 LAB — ABO/RH: ABO/RH(D): O POS

## 2019-05-01 LAB — PROTIME-INR
INR: 1.1 (ref 0.8–1.2)
Prothrombin Time: 13.7 seconds (ref 11.4–15.2)

## 2019-05-01 LAB — SARS CORONAVIRUS 2 (TAT 6-24 HRS): SARS Coronavirus 2: NEGATIVE

## 2019-05-01 LAB — CK TOTAL AND CKMB (NOT AT ARMC)
CK, MB: 24.4 ng/mL — ABNORMAL HIGH (ref 0.5–5.0)
Relative Index: 1.1 (ref 0.0–2.5)
Total CK: 2202 U/L — ABNORMAL HIGH (ref 49–397)

## 2019-05-01 LAB — BILIRUBIN, DIRECT: Bilirubin, Direct: 0.2 mg/dL (ref 0.0–0.2)

## 2019-05-01 LAB — TRIGLYCERIDES: Triglycerides: 167 mg/dL — ABNORMAL HIGH (ref <150)

## 2019-05-01 LAB — LACTIC ACID, PLASMA: Lactic Acid, Venous: 1.6 mmol/L (ref 0.5–1.9)

## 2019-05-01 LAB — FIBRINOGEN: Fibrinogen: 572 mg/dL — ABNORMAL HIGH (ref 210–475)

## 2019-05-01 LAB — LIPASE, BLOOD: Lipase: 22 U/L (ref 11–51)

## 2019-05-01 LAB — OSMOLALITY: Osmolality: 312 mOsm/kg — ABNORMAL HIGH (ref 275–295)

## 2019-05-01 LAB — AMYLASE: Amylase: 84 U/L (ref 28–100)

## 2019-05-01 LAB — TROPONIN I (HIGH SENSITIVITY): Troponin I (High Sensitivity): 107 ng/L (ref <18)

## 2019-05-01 MED ORDER — SODIUM CHLORIDE 0.9 % IV BOLUS
1000.0000 mL | Freq: Once | INTRAVENOUS | Status: AC
  Administered 2019-05-01: 1000 mL via INTRAVENOUS

## 2019-05-01 MED ORDER — PHENYLEPHRINE HCL-NACL 10-0.9 MG/250ML-% IV SOLN
0.0000 ug/min | INTRAVENOUS | Status: DC
  Administered 2019-05-01: 20 ug/min via INTRAVENOUS
  Administered 2019-05-01: 30 ug/min via INTRAVENOUS
  Administered 2019-05-01: 20 ug/min via INTRAVENOUS
  Administered 2019-05-01: 40 ug/min via INTRAVENOUS
  Filled 2019-05-01 (×4): qty 250

## 2019-05-01 MED ORDER — LACTATED RINGERS IV SOLN: INTRAVENOUS | Status: DC

## 2019-05-01 MED ORDER — FREE WATER
250.0000 mL | Freq: Once | Status: AC
  Administered 2019-05-01: 250 mL

## 2019-05-01 MED ORDER — POTASSIUM PHOSPHATES 15 MMOLE/5ML IV SOLN
40.0000 mmol | Freq: Once | INTRAVENOUS | Status: AC
  Administered 2019-05-01: 40 mmol via INTRAVENOUS
  Filled 2019-05-01: qty 13.33

## 2019-05-01 MED ORDER — MANNITOL 20 % IV SOLN
25.0000 g | Freq: Once | Status: AC
  Administered 2019-05-01: 25 g via INTRAVENOUS
  Filled 2019-05-01: qty 125

## 2019-05-01 MED ORDER — ALBUMIN HUMAN 25 % IV SOLN
25.0000 g | Freq: Once | INTRAVENOUS | Status: AC
  Administered 2019-05-01: 25 g via INTRAVENOUS
  Filled 2019-05-01: qty 100

## 2019-05-01 MED ORDER — DEXTROSE 5 % IV BOLUS
300.0000 mL | Freq: Once | INTRAVENOUS | Status: AC
  Administered 2019-05-01: 300 mL via INTRAVENOUS

## 2019-05-01 MED ORDER — ALBUTEROL SULFATE (2.5 MG/3ML) 0.083% IN NEBU
2.5000 mg | INHALATION_SOLUTION | Freq: Once | RESPIRATORY_TRACT | Status: AC
  Administered 2019-05-01: 2.5 mg via RESPIRATORY_TRACT
  Filled 2019-05-01: qty 3

## 2019-05-01 MED ORDER — FUROSEMIDE 10 MG/ML IJ SOLN
20.0000 mg | Freq: Once | INTRAMUSCULAR | Status: AC
  Administered 2019-05-01: 20 mg via INTRAVENOUS
  Filled 2019-05-01: qty 2

## 2019-05-01 MED ORDER — DEXTROSE 5 % IV SOLN: INTRAVENOUS | Status: DC

## 2019-05-01 MED ORDER — FREE WATER
250.0000 mL | Status: DC
  Administered 2019-05-01 (×3): 250 mL

## 2019-05-01 MED ORDER — ALBUTEROL SULFATE (2.5 MG/3ML) 0.083% IN NEBU: 2.5000 mg | INHALATION_SOLUTION | Freq: Four times a day (QID) | RESPIRATORY_TRACT | Status: DC | PRN

## 2019-05-01 NOTE — Anesthesia Preprocedure Evaluation (Addendum)
Anesthesia Evaluation  Patient identified by MRN, date of birth, ID band Patient unresponsive    Reviewed: Unable to perform ROS - Chart review only  Airway Mallampati: Intubated       Dental   Pulmonary Current Smoker and Patient abstained from smoking.,  Aspiration: intubated  05/01/2019 SARS coronavirus NEG   breath sounds clear to auscultation       Cardiovascular  Rhythm:Regular Rate:Normal  Drug overdose: cardiac arrest  05/08/2019 ECHO: EF 55-60%, valves normal   Neuro/Psych ADHDAnoxic brain injury:   The CT scan shows diffuse cerebral edema with loss of gray-white differentiation and pseudosubarachnoid pattern.  There is effacement of the third and fourth ventricles.   Response to painful stimulation to 4 limbs: None             Response to painful central stimuli: None             Pupillary response: Pupils fixed and dilated             Corneal response: Negative             Oculocephalic response: Negative             Vestibular ocular response: Negative             Gag reflex: Negative             Cough reflex: Negative  Apnea testing: a 8 minute apnea test was performed - no spontaneous respiratory effort after 8 mins      GI/Hepatic Elevated LFTs   Endo/Other    Renal/GU      Musculoskeletal   Abdominal   Peds  Hematology   Anesthesia Other Findings 24 year old male with history of polysubstance abuse was found unresponsive at home on 05/12/2019, likely downtime of 20 to 40 minutes, CPR performed and EMS achieved ROSC in 8 minutes  Reproductive/Obstetrics                            Anesthesia Physical Anesthesia Plan  ASA: VI and emergent  Anesthesia Plan: General   Post-op Pain Management:    Induction:   PONV Risk Score and Plan:   Airway Management Planned: Oral ETT  Additional Equipment: Arterial line  Intra-op Plan:   Post-operative Plan:   Informed  Consent:    Continue DNR.     Plan Discussed with: CRNA and Surgeon  Anesthesia Plan Comments: (For [facility] organ donation)       Anesthesia Quick Evaluation

## 2019-05-01 NOTE — Progress Notes (Signed)
Pt was placed in supine position from prone without complication.

## 2019-05-01 NOTE — Progress Notes (Signed)
This note also relates to the following rows which could not be included: SpO2 - Cannot attach notes to unvalidated device data  Changes made per [facility] Services and plan made for remainder of night if all goes well. Next peep recruitment will be about 0200 and then at 0500 with O2 Challenge at that time and ABG.

## 2019-05-01 NOTE — Progress Notes (Signed)
Peep recruitment done for 20 seconds at 0510. Peak pressure prior to recruitment was 29cm H2O.  Peak pressure during recruitment was 42. BP began to drop to 100/42 at 15 seconds. RT stopped recruitment at 20 seconds. RT then began O2 challenge at 0511. FIO2 is 100% and PEEP +5. RT will obtain ABG in 20-30 minutes.

## 2019-05-01 NOTE — Progress Notes (Signed)
Peep recruitment done for 20 second at 0250. Peak Pressure Prior to recruitment at 28 and 40 during recruitment. BP tolerated okay and Hr increased. Patient is currently back on Setting of 40% and +8 peep, next recruitment is due about 5 am with O2 Challenge after recruitment and ABG to follow.

## 2019-05-01 NOTE — Progress Notes (Signed)
Patient transported from 4N31 to CT and back with no complications.  

## 2019-05-01 NOTE — Progress Notes (Signed)
Pt was proned per donor services without complications. Head is turned to the R side. ABG to follow in a few hours.

## 2019-05-01 NOTE — Progress Notes (Signed)
O2 challenge completed and ABG obtained at 0545. Returned ventilator settings to FIO2 40% and PEEP +8

## 2019-05-02 ENCOUNTER — Encounter (HOSPITAL_COMMUNITY): Payer: Medicaid Other | Admitting: Certified Registered Nurse Anesthetist

## 2019-05-02 ENCOUNTER — Inpatient Hospital Stay (HOSPITAL_COMMUNITY): Payer: Medicaid Other | Admitting: Certified Registered Nurse Anesthetist

## 2019-05-02 ENCOUNTER — Encounter (HOSPITAL_COMMUNITY): Admission: EM | Disposition: E | Payer: Self-pay | Source: Home / Self Care | Attending: Critical Care Medicine

## 2019-05-02 HISTORY — PX: ORGAN PROCUREMENT: SHX5270

## 2019-05-02 LAB — PROTIME-INR
INR: 1.2 (ref 0.8–1.2)
Prothrombin Time: 15.2 seconds (ref 11.4–15.2)

## 2019-05-02 LAB — URINALYSIS, ROUTINE W REFLEX MICROSCOPIC
Bilirubin Urine: NEGATIVE
Glucose, UA: NEGATIVE mg/dL
Hgb urine dipstick: NEGATIVE
Ketones, ur: NEGATIVE mg/dL
Leukocytes,Ua: NEGATIVE
Nitrite: NEGATIVE
Protein, ur: NEGATIVE mg/dL
Specific Gravity, Urine: 1.005 (ref 1.005–1.030)
pH: 6 (ref 5.0–8.0)

## 2019-05-02 LAB — CBC
HCT: 36.6 % — ABNORMAL LOW (ref 39.0–52.0)
Hemoglobin: 11.8 g/dL — ABNORMAL LOW (ref 13.0–17.0)
MCH: 32.4 pg (ref 26.0–34.0)
MCHC: 32.2 g/dL (ref 30.0–36.0)
MCV: 100.5 fL — ABNORMAL HIGH (ref 80.0–100.0)
Platelets: 241 10*3/uL (ref 150–400)
RBC: 3.64 MIL/uL — ABNORMAL LOW (ref 4.22–5.81)
RDW: 14.3 % (ref 11.5–15.5)
WBC: 15 10*3/uL — ABNORMAL HIGH (ref 4.0–10.5)
nRBC: 0 % (ref 0.0–0.2)

## 2019-05-02 LAB — CULTURE, BAL-QUANTITATIVE W GRAM STAIN
Culture: 30000 — AB
Culture: NORMAL
Special Requests: NORMAL
Special Requests: NORMAL

## 2019-05-02 LAB — POCT I-STAT 7, (LYTES, BLD GAS, ICA,H+H)
Acid-Base Excess: 2 mmol/L (ref 0.0–2.0)
Acid-Base Excess: 3 mmol/L — ABNORMAL HIGH (ref 0.0–2.0)
Bicarbonate: 24.6 mmol/L (ref 20.0–28.0)
Bicarbonate: 26.7 mmol/L (ref 20.0–28.0)
Calcium, Ion: 1.25 mmol/L (ref 1.15–1.40)
Calcium, Ion: 1.22 mmol/L (ref 1.15–1.40)
HCT: 34 % — ABNORMAL LOW (ref 39.0–52.0)
HCT: 31 % — ABNORMAL LOW (ref 39.0–52.0)
Hemoglobin: 11.6 g/dL — ABNORMAL LOW (ref 13.0–17.0)
Hemoglobin: 10.5 g/dL — ABNORMAL LOW (ref 13.0–17.0)
O2 Saturation: 98 %
O2 Saturation: 100 %
Patient temperature: 36
Patient temperature: 36.4
Potassium: 3.1 mmol/L — ABNORMAL LOW (ref 3.5–5.1)
Potassium: 3.4 mmol/L — ABNORMAL LOW (ref 3.5–5.1)
Sodium: 155 mmol/L — ABNORMAL HIGH (ref 135–145)
Sodium: 157 mmol/L — ABNORMAL HIGH (ref 135–145)
TCO2: 26 mmol/L (ref 22–32)
TCO2: 28 mmol/L (ref 22–32)
pCO2 arterial: 30.9 mmHg — ABNORMAL LOW (ref 32.0–48.0)
pCO2 arterial: 37 mmHg (ref 32.0–48.0)
pH, Arterial: 7.505 — ABNORMAL HIGH (ref 7.350–7.450)
pH, Arterial: 7.463 — ABNORMAL HIGH (ref 7.350–7.450)
pO2, Arterial: 90 mmHg (ref 83.0–108.0)
pO2, Arterial: 327 mmHg — ABNORMAL HIGH (ref 83.0–108.0)

## 2019-05-02 LAB — COMPREHENSIVE METABOLIC PANEL
ALT: 62 U/L — ABNORMAL HIGH (ref 0–44)
AST: 64 U/L — ABNORMAL HIGH (ref 15–41)
Albumin: 2.9 g/dL — ABNORMAL LOW (ref 3.5–5.0)
Alkaline Phosphatase: 59 U/L (ref 38–126)
Anion gap: 9 (ref 5–15)
BUN: 7 mg/dL (ref 6–20)
CO2: 24 mmol/L (ref 22–32)
Calcium: 8.7 mg/dL — ABNORMAL LOW (ref 8.9–10.3)
Chloride: 122 mmol/L — ABNORMAL HIGH (ref 98–111)
Creatinine, Ser: 0.65 mg/dL (ref 0.61–1.24)
GFR calc Af Amer: >60 mL/min (ref >60)
GFR calc non Af Amer: >60 mL/min (ref >60)
Glucose, Bld: 125 mg/dL — ABNORMAL HIGH (ref 70–99)
Potassium: 3.5 mmol/L (ref 3.5–5.1)
Sodium: 155 mmol/L — ABNORMAL HIGH (ref 135–145)
Total Bilirubin: 0.5 mg/dL (ref 0.3–1.2)
Total Protein: 5.7 g/dL — ABNORMAL LOW (ref 6.5–8.1)

## 2019-05-02 LAB — GLUCOSE, CAPILLARY
Glucose-Capillary: 109 mg/dL — ABNORMAL HIGH (ref 70–99)
Glucose-Capillary: 115 mg/dL — ABNORMAL HIGH (ref 70–99)
Glucose-Capillary: 117 mg/dL — ABNORMAL HIGH (ref 70–99)

## 2019-05-02 LAB — CULTURE, BLOOD (ROUTINE X 2)
Culture: NO GROWTH
Culture: NO GROWTH
Special Requests: ADEQUATE
Special Requests: ADEQUATE

## 2019-05-02 LAB — APTT: aPTT: 33 seconds (ref 24–36)

## 2019-05-02 LAB — URINE CULTURE: Culture: NO GROWTH

## 2019-05-02 LAB — PHOSPHORUS: Phosphorus: 5.4 mg/dL — ABNORMAL HIGH (ref 2.5–4.6)

## 2019-05-02 LAB — MAGNESIUM: Magnesium: 1.9 mg/dL (ref 1.7–2.4)

## 2019-05-02 SURGERY — SURGICAL PROCUREMENT, ORGAN
Anesthesia: Choice | Site: Abdomen

## 2019-05-02 MED ORDER — HEPARIN SODIUM (PORCINE) 1000 UNIT/ML IJ SOLN
INTRAMUSCULAR | Status: DC | PRN
  Administered 2019-05-02: 30000 [IU] via INTRAVENOUS

## 2019-05-02 MED ORDER — LACTATED RINGERS IV SOLN: INTRAVENOUS | Status: DC | PRN

## 2019-05-02 MED ORDER — LACTATED RINGERS IV BOLUS
1000.0000 mL | Freq: Once | INTRAVENOUS | Status: AC
  Administered 2019-05-02: 1000 mL via INTRAVENOUS

## 2019-05-02 MED ORDER — 0.9 % SODIUM CHLORIDE (POUR BTL) OPTIME
TOPICAL | Status: DC | PRN
  Administered 2019-05-02: 07:00:00 8000 mL

## 2019-05-02 MED ORDER — 0.9 % SODIUM CHLORIDE (POUR BTL) OPTIME
TOPICAL | Status: DC | PRN
  Administered 2019-05-02: 1000 mL
  Administered 2019-05-02: 2000 mL

## 2019-05-02 SURGICAL SUPPLY — 99 items
BLADE CLIPPER SURG (BLADE) ×2 IMPLANT
BLADE SAW STERNAL (BLADE) ×3 IMPLANT
BLADE SURG 10 STRL SS (BLADE) IMPLANT
CLIP VESOCCLUDE MED 24/CT (CLIP) IMPLANT
CLIP VESOCCLUDE SM WIDE 24/CT (CLIP) IMPLANT
CNTNR URN SCR LID CUP LEK RST (MISCELLANEOUS) ×1 IMPLANT
CONT SPEC 4OZ STRL OR WHT (MISCELLANEOUS) ×3
COVER BACK TABLE 60X90IN (DRAPES) ×2 IMPLANT
COVER MAYO STAND STRL (DRAPES) IMPLANT
COVER SURGICAL LIGHT HANDLE (MISCELLANEOUS) ×3 IMPLANT
COVER WAND RF STERILE (DRAPES) ×1 IMPLANT
DRAPE HALF SHEET 40X57 (DRAPES) IMPLANT
DRAPE SLUSH MACHINE 52X66 (DRAPES) ×5 IMPLANT
DRSG COVADERM 4X10 (GAUZE/BANDAGES/DRESSINGS) ×2 IMPLANT
DRSG COVADERM 4X14 (GAUZE/BANDAGES/DRESSINGS) ×2 IMPLANT
DRSG TELFA 3X8 NADH (GAUZE/BANDAGES/DRESSINGS) ×3 IMPLANT
DURAPREP 26ML APPLICATOR (WOUND CARE) ×4 IMPLANT
ELECT BLADE 6.5 EXT (BLADE) IMPLANT
ELECT REM PT RETURN 9FT ADLT (ELECTROSURGICAL) ×6
ELECTRODE REM PT RTRN 9FT ADLT (ELECTROSURGICAL) ×2 IMPLANT
GAUZE 4X4 16PLY RFD (DISPOSABLE) IMPLANT
GLOVE BIO SURGEON STRL SZ7 (GLOVE) ×4 IMPLANT
GLOVE BIO SURGEON STRL SZ7.5 (GLOVE) ×2 IMPLANT
GLOVE BIO SURGEON STRL SZ8 (GLOVE) IMPLANT
GLOVE BIO SURGEON STRL SZ8.5 (GLOVE) IMPLANT
GLOVE BIOGEL PI IND STRL 7.0 (GLOVE) IMPLANT
GLOVE BIOGEL PI IND STRL 7.5 (GLOVE) IMPLANT
GLOVE BIOGEL PI IND STRL 8 (GLOVE) IMPLANT
GLOVE BIOGEL PI IND STRL 8.5 (GLOVE) IMPLANT
GLOVE BIOGEL PI INDICATOR 7.0 (GLOVE) ×2
GLOVE BIOGEL PI INDICATOR 7.5 (GLOVE)
GLOVE BIOGEL PI INDICATOR 8 (GLOVE)
GLOVE BIOGEL PI INDICATOR 8.5 (GLOVE)
GLOVE SS BIOGEL STRL SZ 7 (GLOVE) IMPLANT
GLOVE SUPERSENSE BIOGEL SZ 7 (GLOVE) ×2
GLOVE SURG SS PI 7.0 STRL IVOR (GLOVE) ×2 IMPLANT
GLOVE SURG SS PI 7.5 STRL IVOR (GLOVE) IMPLANT
GLOVE SURG SS PI 8.0 STRL IVOR (GLOVE) IMPLANT
GOWN STRL REUS W/ TWL LRG LVL3 (GOWN DISPOSABLE) ×4 IMPLANT
GOWN STRL REUS W/ TWL XL LVL3 (GOWN DISPOSABLE) ×2 IMPLANT
GOWN STRL REUS W/TWL 2XL LVL3 (GOWN DISPOSABLE) ×2 IMPLANT
GOWN STRL REUS W/TWL LRG LVL3 (GOWN DISPOSABLE) ×15
GOWN STRL REUS W/TWL XL LVL3 (GOWN DISPOSABLE) ×6
HANDLE SUCTION POOLE (INSTRUMENTS) IMPLANT
INSTRUMENT COAXIAL 18GX20CM (NEEDLE) ×2 IMPLANT
KIT POST MORTEM ADULT 36X90 (BAG) ×3 IMPLANT
KIT TURNOVER KIT B (KITS) ×3 IMPLANT
LIGASURE IMPACT 36 18CM CVD LR (INSTRUMENTS) ×2 IMPLANT
LOOP VESSEL MAXI BLUE (MISCELLANEOUS) IMPLANT
LOOP VESSEL MINI RED (MISCELLANEOUS) IMPLANT
MANIFOLD NEPTUNE II (INSTRUMENTS) ×3 IMPLANT
NDL BIOPSY 14X6 SOFT TISS (NEEDLE) IMPLANT
NDL COAXIAL INTRO 17X16.8CM (NEEDLE) IMPLANT
NEEDLE BIOPSY 14X6 SOFT TISS (NEEDLE) ×3 IMPLANT
NEEDLE COAXIAL INTRO 17X16.8CM (NEEDLE) ×3 IMPLANT
NS IRRIG 1000ML POUR BTL (IV SOLUTION) ×22 IMPLANT
PACK AORTA (CUSTOM PROCEDURE TRAY) ×3 IMPLANT
PAD ARMBOARD 7.5X6 YLW CONV (MISCELLANEOUS) ×6 IMPLANT
PAD DRESSING TELFA 3X8 NADH (GAUZE/BANDAGES/DRESSINGS) ×1 IMPLANT
PENCIL BUTTON HOLSTER BLD 10FT (ELECTRODE) ×1 IMPLANT
RELOAD LINEAR CUT PROX 55 BLUE (ENDOMECHANICALS) ×12 IMPLANT
RELOAD STAPLE 55 3.8 BLU REG (ENDOMECHANICALS) IMPLANT
SOL PREP POV-IOD 4OZ 10% (MISCELLANEOUS) ×6 IMPLANT
SPONGE INTESTINAL PEANUT (DISPOSABLE) IMPLANT
SPONGE LAP 18X18 RF (DISPOSABLE) ×2 IMPLANT
STAPLER PROXIMATE 55 BLUE (STAPLE) ×2 IMPLANT
STAPLER VISISTAT 35W (STAPLE) ×1 IMPLANT
SUCTION POOLE HANDLE (INSTRUMENTS) ×9
SUT BONE WAX W31G (SUTURE) ×2 IMPLANT
SUT ETHIBOND 5 LR DA (SUTURE) IMPLANT
SUT ETHILON 1 LR 30 (SUTURE) ×6 IMPLANT
SUT ETHILON 2 LR (SUTURE) ×12 IMPLANT
SUT ETHILON 4 0 PS 2 18 (SUTURE) ×2 IMPLANT
SUT PROLENE 3 0 RB 1 (SUTURE) ×2 IMPLANT
SUT PROLENE 3 0 SH 1 (SUTURE) IMPLANT
SUT PROLENE 4 0 RB 1 (SUTURE)
SUT PROLENE 4-0 RB1 .5 CRCL 36 (SUTURE) IMPLANT
SUT PROLENE 5 0 C 1 24 (SUTURE) IMPLANT
SUT PROLENE 6 0 BV (SUTURE) IMPLANT
SUT SILK 0 SH 30 (SUTURE) ×8 IMPLANT
SUT SILK 0 TIES 10X30 (SUTURE) IMPLANT
SUT SILK 1 SH (SUTURE) IMPLANT
SUT SILK 1 TIES 10X30 (SUTURE) IMPLANT
SUT SILK 2 0 (SUTURE)
SUT SILK 2 0 SH (SUTURE) IMPLANT
SUT SILK 2 0 SH CR/8 (SUTURE) IMPLANT
SUT SILK 2 0 TIES 10X30 (SUTURE) IMPLANT
SUT SILK 2-0 18XBRD TIE 12 (SUTURE) IMPLANT
SUT SILK 3 0 SH CR/8 (SUTURE) IMPLANT
SUT SILK 3 0 TIES 10X30 (SUTURE) IMPLANT
SWAB COLLECTION DEVICE MRSA (MISCELLANEOUS) IMPLANT
SWAB CULTURE ESWAB REG 1ML (MISCELLANEOUS) IMPLANT
SYR 50ML LL SCALE MARK (SYRINGE) IMPLANT
SYRINGE TOOMEY DISP (SYRINGE) ×2 IMPLANT
TAPE UMBILICAL 1/8 X36 TWILL (MISCELLANEOUS) IMPLANT
TUBE CONNECTING 12'X1/4 (SUCTIONS) ×1
TUBE CONNECTING 12X1/4 (SUCTIONS) ×2 IMPLANT
WATER STERILE IRR 1000ML POUR (IV SOLUTION) IMPLANT
YANKAUER SUCT BULB TIP NO VENT (SUCTIONS) ×3 IMPLANT

## 2019-05-02 NOTE — OR Nursing (Signed)
1660 Saline drops instilled into both eyes. Saline moistened gauze placed over both eyes.

## 2019-05-02 NOTE — Progress Notes (Signed)
Patient was transported to OR for donor with no complications.

## 2019-05-02 NOTE — Progress Notes (Signed)
PEEP recruitment completed for 20 seconds at 0154.  Peak pressures prior to recruitment were 22 cmH2O - 24 cmH2O .  Peak pressures increased to 42 during the recruitment .  BP dropped to 98/53 at 20 seconds. RT returned patient to original settings.

## 2019-05-02 NOTE — Anesthesia Postprocedure Evaluation (Signed)
Anesthesia Post Note  Patient: Andrew Christensen  Procedure(s) Performed: ORGAN PROCUREMENT - HEART,  LIVER, KIDNEYS, PANCREAS (N/A Abdomen)     Comments: Pt deceased/organ donation                 Joliyah Lippens,E. Indria Bishara

## 2019-05-02 NOTE — Transfer of Care (Signed)
Immediate Anesthesia Transfer of Care Note  Patient: Andrew Christensen  Procedure(s) Performed: ORGAN PROCUREMENT - HEART,  LIVER, KIDNEYS, PANCREAS (N/A Abdomen)  Patient Location: OR.  Remains on OR table for organ procurement.  Anesthesia Type: General.  Level of Consciousness: Deceased.  Airway & Oxygen Therapy: None.  Post-op Assessment: Deceased.  Post vital signs: Patient is deceased.  Last Vitals:  Vitals Value Taken Time  BP    Temp    Pulse    Resp    SpO2      Last Pain:  Vitals:   05/01/19 0845  TempSrc: Esophageal         Complications: No apparent anesthesia complications

## 2019-05-03 ENCOUNTER — Encounter: Payer: Self-pay | Admitting: *Deleted

## 2019-05-03 LAB — SURGICAL PATHOLOGY

## 2019-05-05 LAB — CULTURE, BLOOD (ROUTINE X 2): Special Requests: ADEQUATE

## 2019-05-06 LAB — CULTURE, BLOOD (ROUTINE X 2)
Culture: NO GROWTH
Culture: NO GROWTH
Special Requests: ADEQUATE
Special Requests: ADEQUATE

## 2019-05-14 ENCOUNTER — Encounter (HOSPITAL_COMMUNITY): Payer: Self-pay | Admitting: Psychiatry

## 2019-05-24 NOTE — Progress Notes (Signed)
NAME:  Andrew Christensen, MRN:  [id number], DOB:  11-28-1995, LOS: 3 ADMISSION DATE:  05/13/2019, CONSULTATION DATE:  05/08/2019 REFERRING MD: Durene Cal  , CHIEF COMPLAINT:  Cardiac Arrest   Brief History   24 year old male with a history of polysubstance abuse who was found unresponsive at home, status post 8 minutes of CPR by EMS with ROSC.  Intubated and PCCM asked to admit  History of present illness   24 year old male with a past medical history of polysubstance abuse who is coming from home after being found unresponsive.  His father is at the bedside and states that they work together all day, he seemed happy and to be doing well, he dropped him off at home around 5:55 PM and went back out again.  He is unsure of the timing, but thinks he came back home about 40 minutes later and found his son naked and curled up in the corner of the bathroom unresponsive and not breathing; states he was blue around the mouth.  His father immediately called 911 and started CPR, on EMS arrival patient was in asystole.  CPR for approximately 8 minutes with 1 epi given and then ROSC with sinus tach.  Patient was intubated and transported to the emergency department.  CT head shows diffuse loss of sulcal markings, no definite edema.  CXR clear, but CT cervical spine with confluent bilateral pulmonary opacities in the visualized lung apices concerning for multifocal pneumonia.  UDS positive for heroine and amphetamines.  Lactic acid up-trending from 3.9 to 8.3.  He was given fentanyl and Versed in the ED, has had some myoclonus, and breathing over the vent, but otherwise not responsive.  He is prescribed Adderall, but his father has been concerned that he might return to snorting heroin as he has in the past  Patient with was admitted in January 2021 for psychosis and polysubstance abuse (cannabis, K2, Adderall) and was discharged home at that time.  His father notes no recent suicidal ideation or obvious depression though  previous psych note states: "His father reports that his mother was addicted to numerous opiates in her lifetime and actually died 3 years ago from complications from opiate dependency but states that she actually gave [place] pills in the sixth grade, he was actually taking and selling Percocet at that age."   Past Medical History  Polysubstance abuse, ADD  Significant Hospital Events   3/5 admit to PCCM  Consults:    Procedures:  3/5 ETT  Significant Diagnostic Tests:  3/5 CT head>>Mild diffuse loss of sulcal markings when compared to the prior study dated March 18, 2019, without definite evidence of diffuse cerebral edema. MRI correlation is recommended.  3/5 CT C-spine>>Confluent bilateral pulmonary opacities concerning for multifocal pneumonia. Clinical correlation is recommended.  3/6 MRI: Probable early changes of hypoxic ischemic injury, manifest as abnormal early restricted diffusion and T2 signal within the caudate and putamen portion of the basal ganglia and probably affecting the diffuse cortical brain.   Micro Data:  3/5 SARS-Cov-2>> negative 3/5 blood cultures x2>>  Antimicrobials:  Zosyn 3/5-  Interim history/subjective:  3/7: still in status on increased sedation. req pressors overnight and this am for hypotension most likely 2/2 sedation. Spoke with father at length as well as pt's support system who have clearly stated that should "scotty" not have ability to return to a fairly independent state he would not want to continue living. They would not want prolonged life support unless that was possible. Of course they  want to give him every possible chance to recover but are clear of his wishes.   3/6: mri with probable anoxic injury. Neuro consult pending.   3/8: Patient seen with hypertensive episode and tachycardia followed by hypotension requiring pressor support concerning for possible herniation. Also appears to now be in diabetes insipidus with large  volume output and hypernatremia.   Objective   Blood pressure (!) 103/55, pulse 66, temperature (!) 97.4 F (36.3 C), temperature source Oral, resp. rate 18, weight 105.2 kg, SpO2 96 %.    Vent Mode: PRVC FiO2 (%):  [30 %-40 %] 30 % Set Rate:  [18 bmp] 18 bmp Vt Set:  [650 mL] 650 mL PEEP:  [5 cmH20] 5 cmH20 Plateau Pressure:  [13 cmH20-18 cmH20] 15 cmH20   Intake/Output Summary (Last 24 hours) at 05/01/2019 0844 Last data filed at 05/13/2019 0800 Gross per 24 hour  Intake 3391.41 ml  Output 6800 ml  Net -3408.59 ml   Filed Weights   04/28/19 0500 04/29/19 0500 05/11/2019 0500  Weight: 97.5 kg 105.1 kg 105.2 kg   Physical exam  General: Adult male lying in bed in NAD HEENT: ETT, MM pink/moist, PERRL,  Neuro: Comatose CV: s1s2 regular rate and rhythm, no murmur, rubs, or gallops,  PULM:  Clear to ascultation bilaterally, no added breath sounds  GI: soft, bowel sounds active in all 4 quadrants, non-tender, non-distended, tolerating TF  Extremities: warm/dry, no edema  Skin: no rashes or lesions  Resolved Hospital Problem list   none  Assessment & Plan:   PEA arrest s/p ROSC with subsequent hypoxic respiratory failure and intubation -not a candidate for cooling due to unknown down time P: Continue ventilator support with lung protective strategies  Head of bed elevated 30 degrees. Plateau pressures less than 30 cm H20.  Follow intermittent chest x-ray and ABG.   SAT/SBT as tolerated, mentation preclude extubation  Ensure adequate pulmonary hygiene  Follow cultures  VAP bundle in place  PAD protocol Continuous telemetry   Acute encephalopathy Status myoclonus Possible brain herniation and early DI 2/2 above P: Neurology following LTM in place  Dose DDAVP now  Follow serial Bmets Follow urine specific gravity  Continue Keppra Propofol/Versed for status  Need code discussion   Likely aspiration pneumonia -Bilateral upper lobe infiltrates on CT -Covid  negative P: Continue zosyn  Follow cultures  Pulmonary hygiene   Hypotension P: Continue Levo for MAP greater than 65 Close monitoring in the ICU setting   ARF, resolved  P:  Follow renal function / urine output Trend Bmet Avoid nephrotoxins, ensure adequate renal perfusion   Hyperglycemia, improved  -no history of DM -a1c 4.6 P: SSI  CBG q4hrs  Elevated Transaminases, improving  -likely early shock liver P: Trend LFTs   Polysubstance abuse -Most suspicious of heroin overdose P: Supportive care   Goals of care:  -Spoke with father at length as well as pt's support system who have clearly stated that should "scotty" not have ability to return to a fairly independent state he would not want to continue living. They would not want prolonged life support unless that was possible. Of course they want to give him every possible chance to recover but are clear of his wishes.  Will discuss with family again today in collaboration with neurology to determine next steps as patient prognosis remains very poor with no signs of herniations.   Best practice:  Diet: N.p.o. starting tf Pain/Anxiety/Delirium protocol (if indicated): Propofol/versed VAP protocol (if indicated): Head  of the bed 30 degrees, daily SBT DVT prophylaxis: Lovenox GI prophylaxis: Pepcid Glucose control: SSI Mobility: Bedrest Code Status: Full code Family Communication: father via phone 3/7 Disposition: ICU  Labs   CBC: Recent Labs  Lab 05/10/2019 1925 05/22/2019 1925 05/08/2019 1953 04/28/19 0018 04/28/19 0648 04/29/19 1027 04/26/2019 0531  WBC 13.7*  --   --   --  24.6* 14.3* 9.5  NEUTROABS 7.4  --   --   --   --   --   --   HGB 14.7   < > 15.0 15.6 16.3 13.6 14.2  HCT 44.9   < > 44.0 46.0 47.3 41.1 41.9  MCV 100.4*  --   --   --  94.0 97.9 95.4  PLT 319  --   --   --  309 249 279   < > = values in this interval not displayed.    Basic Metabolic Panel: Recent Labs  Lab 04/28/19 0648  04/28/19 0648 04/28/19 1217 04/28/19 1654 04/29/19 0548 04/29/19 1027 04/29/19 1635 04/29/19 2043 05/21/2019 0115 05/01/2019 0531  NA 140  --   --   --   --  139  --  140 145 152*  K 4.4  --   --   --   --  3.9  --  3.5 4.1 4.2  CL 104  --   --   --   --  107  --  106 112* 120*  CO2 22  --   --   --   --  24  --  23 23 22   GLUCOSE 102*  --   --   --   --  112*  --  123* 101* 120*  BUN 15  --   --   --   --  11  --  13 10 10   CREATININE 0.98  --   --   --   --  0.69  --  0.64 0.59* 0.74  CALCIUM 9.1  --   --   --   --  8.4*  --  8.6* 8.9 9.2  MG 2.0   < > 1.9 2.0 2.1  --  2.0 2.0  --   --   PHOS 3.4  --  2.6 3.6 2.1*  --  2.2*  --   --   --    < > = values in this interval not displayed.   GFR: CrCl cannot be calculated (Unknown ideal weight.). Recent Labs  Lab 05/11/2019 1925 05/07/2019 2125 04/28/19 0648 04/29/19 1027 05/11/2019 0531  WBC 13.7*  --  24.6* 14.3* 9.5  LATICACIDVEN 8.3* 3.9*  --  2.9*  --     Liver Function Tests: Recent Labs  Lab 05/23/2019 1925 04/28/19 0648 04/29/19 1027 04/29/2019 0531  AST 122* 81* 55* 65*  ALT 134* 133* 74* 63*  ALKPHOS 87 85 62 64  BILITOT 0.5 0.7 0.7 0.4  PROT 5.9* 6.3* 5.4* 5.5*  ALBUMIN 3.7 3.9 2.8* 2.8*   No results for input(s): LIPASE, AMYLASE in the last 168 hours. Recent Labs  Lab 05/20/2019 1939 04/28/19 1217 04/29/19 1635  AMMONIA 75* 27 76*    ABG    Component Value Date/Time   PHART 7.336 (L) 04/28/2019 0018   PCO2ART 42.1 04/28/2019 0018   PO2ART 110.0 (H) 04/28/2019 0018   HCO3 22.6 04/28/2019 0018   TCO2 24 04/28/2019 0018   ACIDBASEDEF 3.0 (H) 04/28/2019 0018   O2SAT 98.0 04/28/2019 0018  Coagulation Profile: Recent Labs  Lab May 11, 2019 1925  INR 1.1    Cardiac Enzymes: Recent Labs  Lab 04/29/19 1027  CKTOTAL 1,174*    HbA1C: Hgb A1c MFr Bld  Date/Time Value Ref Range Status  04/28/2019 12:22 AM 4.6 (L) 4.8 - 5.6 % Final    Comment:    (NOTE) Pre diabetes:          5.7%-6.4% Diabetes:               >6.4% Glycemic control for   <7.0% adults with diabetes     CBG: Recent Labs  Lab 04/29/19 1603 04/29/19 2007 04/29/19 2335 05/18/2019 0346 05/10/2019 0745  GLUCAP 95 138* 86 88 108*     Critical care time:    Performed by: Delfin Gant  Total critical care time: 45 minutes  Critical care time was exclusive of separately billable procedures and treating other patients.  Critical care was necessary to treat or prevent imminent or life-threatening deterioration.  Critical care was time spent personally by me on the following activities: development of treatment plan with patient and/or surrogate as well as nursing, discussions with consultants, evaluation of patient's response to treatment, examination of patient, obtaining history from patient or surrogate, ordering and performing treatments and interventions, ordering and review of laboratory studies, ordering and review of radiographic studies, pulse oximetry and re-evaluation of patient's condition.  Delfin Gant, NP-C [facility] Contact / Pager information can be found on Amion  05/05/2019, 9:07 AM

## 2019-05-24 NOTE — Progress Notes (Signed)
Subjective: Patient did not have any further seizures on EEG.  ROS: Unable to obtain due to poor mental status  Examination  Vital signs in last 24 hours: Temp:  [93.5 F (34.2 C)-97.9 F (36.6 C)] 97.9 F (36.6 C) (03/08 0800) Pulse Rate:  [65-151] 66 (03/08 0900) Resp:  [15-20] 18 (03/08 0900) BP: (90-163)/(47-130) 103/68 (03/08 0900) SpO2:  [92 %-100 %] 98 % (03/08 0900) FiO2 (%):  [30 %-40 %] 30 % (03/08 0800) Weight:  [105.2 kg] 105.2 kg (03/08 0500)  General: lying in bed, not in apparent distress CVS: pulse-normal rate and rhythm RS: Clear to auscultation bilaterally, intubated Extremities: normal, warm  Neuro: On propofol and Versed MS: Comatose, does not open eyes to noxious stimuli CN: Bilateral pupil 6-7 mm dilated and not reacting to light, corneal reflex absent, oculocephalic reflex absent, gag reflex absent Motor: Does not withdraw to noxious stimuli in all 4 extremities Reflexes: Areflexia   Basic Metabolic Panel: Recent Labs  Lab 04/28/19 0648 04/28/19 0648 04/28/19 1217 04/28/19 1654 04/29/19 0548 04/29/19 1027 04/29/19 1027 04/29/19 1635 04/29/19 2043 05/08/2019 0115 04/23/2019 0531  NA 140  --   --   --   --  139  --   --  140 145 152*  K 4.4  --   --   --   --  3.9  --   --  3.5 4.1 4.2  CL 104  --   --   --   --  107  --   --  106 112* 120*  CO2 22  --   --   --   --  24  --   --  23 23 22  GLUCOSE 102*  --   --   --   --  112*  --   --  123* 101* 120*  BUN 15  --   --   --   --  11  --   --  13 10 10  CREATININE 0.98  --   --   --   --  0.69  --   --  0.64 0.59* 0.74  CALCIUM 9.1   < >  --   --   --  8.4*   < >  --  8.6* 8.9 9.2  MG 2.0   < > 1.9 2.0 2.1  --   --  2.0 2.0  --   --   PHOS 3.4  --  2.6 3.6 2.1*  --   --  2.2*  --   --   --    < > = values in this interval not displayed.    CBC: Recent Labs  Lab 04/29/2019 1925 05/21/2019 1925 05/13/2019 1953 04/28/19 0018 04/28/19 0648 04/29/19 1027 05/03/2019 0531  WBC 13.7*  --   --   --   24.6* 14.3* 9.5  NEUTROABS 7.4  --   --   --   --   --   --   HGB 14.7   < > 15.0 15.6 16.3 13.6 14.2  HCT 44.9   < > 44.0 46.0 47.3 41.1 41.9  MCV 100.4*  --   --   --  94.0 97.9 95.4  PLT 319  --   --   --  309 249 279   < > = values in this interval not displayed.     Coagulation Studies: Recent Labs    05/06/2019 1925  LABPROT 13.8  INR 1.1    Imaging   MRI brain without contrast 04/28/2019: Probable early changes of hypoxic ischemic injury, manifest as abnormal early restricted diffusion and T2 signal within the caudate and putamen portion of the basal ganglia and probably affecting the diffuse cortical brain.      CT Head wo contrast: Extensive cerebral edema, now with loss of the normal gray-white junction, complete bilateral sulcal effacement and effacement of the basilar cisterns, third ventricle and most of the fourth ventricle. Findings consistent with evolving diffuse anoxic brain injury.     ASSESSMENT AND PLAN: 24 year old male with history of polysubstance abuse was found unresponsive at home on 04/29/2019, likely downtime of 20 to 40 minutes, CPR performed and EMS achieved ROSC in 8 minutes.  Was not a candidate for TTM.  Was noted to have myoclonic status epilepticus on arrival.  Anoxic brain injury Cardiac arrest Status myoclonus (resolved) Cerebral edema Cerebral herniation -MRI performed on 04/28/2019 already showed early anoxic injury.  Patient also had status myoclonus within 24 hours of cardiac arrest. -Exam today shows fixed dilated pupils, absent cranial nerves, does not initiate respiration, does not withdraw to noxious stimuli.  Of note exam was performed while patient was on propofol and Versed.  Recommendations -LTM EEG showed generalized background suppression.  Exam today concerning for brain death (although cannot of do an official brain death exam as patient still on sedation) -Therefore, will discontinue sedation at this point -Ordered CT head  without contrast to look for herniation -We will call and update patient's father after CT head results are available -Suspect irreversible neurologic injury with unlikely chances of recovery to independent function -Per RN, patient is also an organ donor.  May require NM brain flow study eventually -Okay to use IV Ativan 2 mg or IV Versed 5 mg for any clinical seizure-like activity   ADDENDUM -CT head today shows diffuse orbital edema and loss of gray-white matter differentiation consistent with anoxic brain injury -Called and updated patient's father who states he and 3 other family members will come to hospital to pray for him.  He would like to transition to comfort care after that.  He also states he is okay proceeding with organ donation evaluation. -Met and updated patient's nurse at bedside regarding my discussion with father  CRITICAL CARE Performed by: Lora Havens   Total critical care time: 35 minutes  Critical care time was exclusive of separately billable procedures and treating other patients.  Critical care was necessary to treat or prevent imminent or life-threatening deterioration.  Critical care was time spent personally by me on the following activities: development of treatment plan with patient and/or surrogate as well as nursing, discussions with consultants, evaluation of patient's response to treatment, examination of patient, obtaining history from patient or surrogate, ordering and performing treatments and interventions, ordering and review of laboratory studies, ordering and review of radiographic studies, pulse oximetry and re-evaluation of patient's condition.

## 2019-05-24 NOTE — Progress Notes (Signed)
Apnea test was performed with MD at bedside. Pt was taken off of vent, 8L [place] was inserted into ETT, and cuff was deflated.  Apnea test lasted for 8 minutes. During test, turned 02 up to 10 L per MD. ABG was done before and after test. Pt is back on ventilator on full support with a inflated cuff.

## 2019-05-24 NOTE — Procedures (Addendum)
Central Venous Catheter Insertion Procedure Note Andrew Christensen [id number] [birth year]  Procedure: Insertion of Central Venous Catheter Indications: Assessment of intravascular volume, Drug and/or fluid administration and Frequent blood sampling  Procedure Details Consent: Risks of procedure as well as the alternatives and risks of each were explained to the (patient/caregiver).  Consent for procedure obtained. Time Out: Verified patient identification, verified procedure, site/side was marked, verified correct patient position, special equipment/implants available, medications/allergies/relevent history reviewed, required imaging and test results available.  Performed  Maximum sterile technique was used including antiseptics, cap, gloves, gown, hand hygiene, mask and sheet. Skin prep: Chlorhexidine; local anesthetic administered A antimicrobial bonded/coated triple lumen catheter was placed in the right subclavian vein using the Seldinger technique. 20cm 65F catheter inserted to 15cm under ultrasound guidance.      Evaluation Blood flow good Complications: No apparent complications Patient did tolerate procedure well. Chest X-ray ordered to verify placement.  CXR: pending.  Andrew Christensen 04/28/2019, 6:13 PM

## 2019-05-24 NOTE — Progress Notes (Signed)
25 mL of versed wasted with Paleeta RN

## 2019-05-24 NOTE — Procedures (Addendum)
Patient Name:Andrew Christensen RFX:[id number] Epilepsy Attending:Freada Twersky Annabelle Harman Referring Physician/Provider:Jessica Mayford Knife, NP Duration: 04/29/2019 1157 to 05/23/19 1057  Patient history:23yo M found down and noted to have myoclonic jerks. EEG to evaluate for seizure  Level of alertness:comatose  AEDs during EEG study:Versed, propofol, keppra, vpa  Technical aspects: This EEG study was done with scalp electrodes positioned according to the 10-20 International system of electrode placement. Electrical activity was acquired at a sampling rate of 500Hz  and reviewed with a high frequency filter of 70Hz  and a low frequency filter of 1Hz . EEG data were recorded continuously and digitally stored.  DESCRIPTION: At the beginning of study, eeg showed sharp waves in left frontotemporal region which gradually evolved into background suppression.  EEG not reactive to tactile stimulation.  Hyperventilation and photic stimulation were not performed.  ABNORMALITY - Sharp waves, left fronto-temporal region - Background suppression, generalized  IMPRESSION: This studyinitially showed evidence of epileptogenicity In left frontotemporal region as well as profound diffuse encephalopathy, non specific to etiology. No seizures were seen during this study.   Antonietta Lansdowne 

## 2019-05-24 NOTE — Progress Notes (Signed)
Pharmacy Antibiotic Note  Andrew Christensen is a 24 y.o. male admitted on 05/08/2019 after being found unresponsive at home, now with suspected aspiration pneumonia.  Pharmacy has been consulted for Unasyn dosing.  Patient was originally started on Zosyn for aspiration pneumonia. After discussing with the NP, we decided it was appropriate to de-escalate therapy. Notably, patient also has 1/2 blood cultures positive for strep mitis/oralis (likely contaminant, but can be causative pathogen for EI). WBC WNL. Scr stable at 0.71. Patient is afebrile.   Plan: Discontinue Zosyn Start Unasyn 1.5 g IV q8hr  Monitor Scr, WBC, c/s, clinical picture  Weight: 231 lb 14.8 oz (105.2 kg)  Temp (24hrs), Avg:96.7 F (35.9 C), Min:93.5 F (34.2 C), Max:97.9 F (36.6 C)  Recent Labs  Lab 05/16/2019 1925 05/21/2019 1925 05/19/2019 2125 04/28/19 0648 04/28/19 0648 04/29/19 1027 04/29/19 2043 2019-05-04 0115 2019/05/04 0531 05-04-19 0935  WBC 13.7*  --   --  24.6*  --  14.3*  --   --  9.5  --   CREATININE 1.50*   < >  --  0.98   < > 0.69 0.64 0.59* 0.74 0.71  LATICACIDVEN 8.3*  --  3.9*  --   --  2.9*  --   --   --   --    < > = values in this interval not displayed.    CrCl cannot be calculated (Unknown ideal weight.).    No Known Allergies  Antimicrobials this admission: Zosyn 3/5 >> 3/8 Unasyn 3/8 >>   Dose adjustments this admission:  Microbiology results: 3/5 BCx: Strep mitis/oralis 1/2 3/6 MRSA PCR: Negative  Thank you for allowing pharmacy to be a part of this patient's care.  Ellison Carwin, PharmD PGY1 Pharmacy Resident

## 2019-05-24 NOTE — Progress Notes (Addendum)
eLink Physician-Brief Progress Note Patient Name: LEONEL MCCOLLUM DOB: 05/30/1995 MRN: [id number]   Date of Service  05/23/2019  HPI/Events of Note  Na+ = 145. Urine Osm and Serum Osm pending.   eICU Interventions  Will order: 1. 0.45 NaCl to run IV at 100 mL/hour.  2. D/C LR IV infusion. 3. Continue to trend Q 4 hour Na+.      Intervention Category Major Interventions: Electrolyte abnormality - evaluation and management  Rhylie Stehr Eugene 05/16/2019, 2:47 AM

## 2019-05-24 NOTE — Progress Notes (Signed)
Changes done and recruitment done with Donor Services at bedside, tolerated okay for now, doing O2 challenge and next move will be post ABG at 2355.

## 2019-05-24 NOTE — Progress Notes (Signed)
Declaration of death by neurological criteria:  Diagnosis: Anoxic brain injury  Imaging: The scan shows diffuse cerebral edema with loss of gray-white differentiation and pseudosubarachnoid pattern.  There is effacement of the third and fourth ventricles.  Confounding factors: Sedation stopped 4 hours ago  Clinical examination:   Response to painful stimulation to 4 limbs: None  Response to painful central stimuli: None  Pupillary response: Pupils fixed and dilated  Corneal response: Negative  Oculocephalic response: Negative  Vestibular ocular response: Negative  Gag reflex: Negative  Cough reflex: Negative   Apnea testing: a 8 minute apnea test was performed - no spontaneous respiratory effort after 8 mins   Blood gas results:  Arterial Blood Gas 1 result:  pO2 75; pCO2 33; pH 7.47;  HCO3 24, %O2 Sat 95.  Arterial Blood Gas result:  pO2 65; pCO2 59; pH 7.28;  HCO3 27.7, %O2 Sat 89.   Confirmatory testing: not required  The patient was declared dead by neurological criteria at 16:16.  [place] donor services was notified.  Lynnell Catalan, MD [facility] ICU Physician [facility] [place] [facility]  Pager: [phone] Mobile: [phone] After hours: [phone].  05/13/2019, 3:28 PM

## 2019-05-24 NOTE — Procedures (Signed)
Arterial Catheter Insertion Procedure Note MALIQ PILLEY [id number] 1995-11-14  Procedure: Insertion of Arterial Catheter  Indications: Blood pressure monitoring  Procedure Details Consent: Risks of procedure as well as the alternatives and risks of each were explained to the (patient/caregiver).  Consent for procedure obtained. Time Out: Verified patient identification, verified procedure, site/side was marked, verified correct patient position, special equipment/implants available, medications/allergies/relevent history reviewed, required imaging and test results available.  Performed  Maximum sterile technique was used including antiseptics, cap, gloves, gown, hand hygiene, mask and sheet. Skin prep: Chlorhexidine; local anesthetic administered 20 gauge catheter was inserted into left radial artery using the Seldinger technique. ULTRASOUND GUIDANCE USED: NO Evaluation Blood flow good; BP tracing good. Complications: No apparent complications.   Hiram Comber 05/11/2019

## 2019-05-24 NOTE — Progress Notes (Signed)
  Echocardiogram 2D Echocardiogram has been performed.  Delcie Roch 05/03/2019, 5:42 PM

## 2019-05-24 NOTE — Procedures (Signed)
Bronchoscopy Procedure Note Andrew Christensen [id number] 1995/11/03  Procedure: Bronchoscopy Indications: Diagnostic evaluation of the airways, Obtain specimens for culture and/or other diagnostic studies and Remove secretions  Procedure Details Consent: Risks of procedure as well as the alternatives and risks of each were explained to the (patient/caregiver).  Consent for procedure obtained. Time Out: Verified patient identification, verified procedure, site/side was marked, verified correct patient position, special equipment/implants available, medications/allergies/relevent history reviewed, required imaging and test results available.  Performed  In preparation for procedure, patient was given 100% FiO2 and bronchoscope lubricated. Sedation: organ donor  Airway entered and the following bronchi were examined: RUL, RML, RLL, LUL, LLL and Bronchi.   Procedures performed: BAL RUL and LLL performed. White secretions cleared from R proximal airway with no secretions distal. Bronchoscope removed.             Evaluation Hemodynamic Status: BP stable throughout; O2 sats: stable throughout Patient's Current Condition: stable Specimens:  Sent purulent fluid Complications: No apparent complications Patient did tolerate procedure well.   Einar Grad Candy Ziegler 05/13/19

## 2019-05-24 NOTE — Progress Notes (Signed)
LTM EEG discontinued - no skin breakdown at unhook.   

## 2019-05-24 NOTE — Progress Notes (Signed)
Pt was transported to CT scan and back to 4N31 without complication.

## 2019-05-24 NOTE — Progress Notes (Signed)
This note also relates to the following rows which could not be included: SpO2 - Cannot attach notes to unvalidated device data  Increased to 100 for O2 challenge by Donor services

## 2019-05-24 NOTE — Progress Notes (Signed)
eLink Physician-Brief Progress Note Patient Name: Andrew Christensen DOB: January 21, 1996 MRN: [id number]   Date of Service  2019/05/16  HPI/Events of Note  Nursing reports 600 mL+ an hour in urine output. Last Na+ = 140.  eICU Interventions  Will order: 1. BMP, Serum Osmolarity and Urine Osmolarity at 1 PM.     Intervention Category Major Interventions: Other:  Lenell Antu May 16, 2019, 12:31 AM

## 2019-05-24 NOTE — Progress Notes (Signed)
Initial Nutrition Assessment  DOCUMENTATION CODES:   Not applicable  INTERVENTION:   Vital AF 1.2 @ 75 ml/hr (1800 ml/day)  Provides: 2160 kcal, 135 grams protein, and 1459 ml free water.   NUTRITION DIAGNOSIS:   Inadequate oral intake related to inability to eat as evidenced by NPO status.  GOAL:   Patient will meet greater than or equal to 90% of their needs  MONITOR:   TF tolerance, Vent status  REASON FOR ASSESSMENT:   Consult, Ventilator Enteral/tube feeding initiation and management  ASSESSMENT:   Pt with PMH of polysubstance abuse who was found down with PEA arrest (heroin overdose) s/p ROSC.    Pt discussed during ICU rounds and with RN.  Concern for anoxic injury and herniation   Patient is currently intubated on ventilator support MV: 10.5 L/min Temp (24hrs), Avg:96.9 F (36.1 C), Min:93.5 F (34.2 C), Max:97.9 F (36.6 C)  Medications reviewed  Labs reviewed: Na 155 (H) UOP: 5600 ml    TF: Vital High Protein @ 40 ml/hr with 30 ml Prostat BID Provides: 1160 kcal, 114 grams protein   NUTRITION - FOCUSED PHYSICAL EXAM:  Deferred; pt out of room   Diet Order:   Diet Order    None      EDUCATION NEEDS:   No education needs have been identified at this time  Skin:  Skin Assessment: Reviewed RN Assessment  Last BM:  unknown  Height:   Ht Readings from Last 1 Encounters:  No data found for Ht    Weight:   Wt Readings from Last 1 Encounters:  05/23/2019 105.2 kg    Ideal Body Weight:     BMI:  There is no height or weight on file to calculate BMI.  Estimated Nutritional Needs:   Kcal:  2211  Protein:  125-150 grams  Fluid:  2 L/day  Cammy Copa., RD, LDN, CNSC See AMi[facility] for contact information

## 2019-05-24 NOTE — Death Summary Note (Signed)
DEATH SUMMARY   Patient Details  Name: ADEM COSTLOW MRN: [id number] DOB: Aug 06, 1995  Admission/Discharge Information   Admit Date:  05/08/19  Date of Death: Date of Death: May 11, 2019  Time of Death: Time of Death: 17-Jun-1614  Length of Stay: 5  Referring Physician: Patient, No Pcp Per   Reason(s) for Hospitalization  Cardiac arrest  Diagnoses  Preliminary cause of death: Cerebral edema Secondary Diagnoses (including complications and co-morbidities):  Active Problems:   Drug overdose   Endotracheally intubated   Aspiration into airway   Metabolic acidosis with normal anion gap and bicarbonate losses Organ donation  Brief Hospital Course (including significant findings, care, treatment, and services provided and events leading to death)  [name] BALTHAZOR is a 24 y.o. year old male who has a history of substance abuse.  He was found unresponsive at home.  Bystander CPR was initiated and spontaneous circulation was achieved after 8 minutes of CPR. In spite of this his CT scan showed diffuse cerebral edema. Drug screen was positive for heroin and amphetamines. He was treated with Versed and fentanyl for myoclonus. After all sedatives had been allowed to washout, he underwent evaluation for brain death and was confirmed dead by neurological criteria and apnea test at 1616 3/18. CDS was notified and family was consented for organ donation. He underwent organ procurement for heart liver kidneys and pancreas.    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 Transplant donor evaluation. EXAM: CT CHEST, ABDOMEN AND PELVIS WITHOUT CONTRAST TECHNIQUE: Multidetector CT imaging of the chest, abdomen and pelvis was performed following the standard protocol without IV contrast. COMPARISON:  Portable chest earlier today. FINDINGS: CT CHEST  FINDINGS Cardiovascular: No cardiomegaly or pericardial effusion. No aortic or coronary artery atherosclerosis is evident. Right subclavian approach central line. Vascular patency is not evaluated in the absence of IV contrast. Mediastinum/Nodes: No mediastinal lymphadenopathy. Enteric tube courses in the esophagus to the stomach. Lungs/Pleura: Endotracheal tube terminates above the carina. The central airways are patent but there are retained secretions in the bronchus intermedius. There is confluent bilateral lower lobe opacity with air bronchograms on the right. Similar confluent peribronchial opacity in the dependent right upper lobe. Additional right upper lobe scattered peribronchial patchy opacity. Early peribronchial ground-glass opacity in the left upper lobe. No definite pleural fluid. Musculoskeletal: Negative. CT ABDOMEN PELVIS FINDINGS Hepatobiliary: Appearance of mild generalized gallbladder wall thickening or trace pericholecystic fluid on coronal image 38. But otherwise negative noncontrast liver and gallbladder. Pancreas: Negative. Spleen: Negative. Adrenals/Urinary Tract: Normal adrenal glands. Nonobstructed kidneys with decompressed ureters. No nephrolithiasis. Foley catheter within the bladder which is decompressed aside from some gas. Stomach/Bowel: Decompressed descending and rectosigmoid colon. Fluid in nondistended right and transverse colon. Evidence of a normal appendix. Fluid-filled but nondilated small bowel. Enteric tube loops in the stomach, terminating in the fundus with side hole at the proximal gastric body. Decompressed duodenum. No free air. No abdominal free fluid. Vascular/Lymphatic: Vascular patency is not evaluated in the absence of IV contrast. No atherosclerosis identified. No lymphadenopathy. Reproductive: Urethral catheter in place, otherwise negative. Other: Trace pelvic free fluid. Musculoskeletal: Negative. IMPRESSION: 1. Lines and tubes appear adequately placed. 2.  Bilateral aspiration/pneumonia. Confluent bilateral lower lobe opacity and developing bilateral upper lobe bronchopneumonia greater on the right. No definite pleural effusion. 3. Mild nonspecific gallbladder wall thickening or pericholecystic fluid. 4. Trace pelvic free fluid,  nonspecific. Electronically Signed   By: Odessa Fleming M.D.   On: 05/12/2019 23:31   DG Chest 1 View  Result Date: 05/01/2019 CLINICAL DATA:  Organ donation EXAM: CHEST  1 VIEW COMPARISON:  Yesterday FINDINGS: Endotracheal tube tip is just below the clavicular heads. The orogastric tube reaches the stomach. Right subclavian line with tip at the upper SVC. Patchy bilateral pulmonary infiltrate which has progressed from chest x-ray. Normal heart size. No visible effusion or pneumothorax. IMPRESSION: 1. Unremarkable hardware positioning. 2. Pulmonary infiltrates as seen by CT yesterday. Electronically Signed   By: Marnee Spring M.D.   On: 05/01/2019 07:23   CT HEAD WO CONTRAST  Result Date: 05/09/2019 CLINICAL DATA:  Possible anoxic brain injury. EXAM: CT HEAD WITHOUT CONTRAST TECHNIQUE: Contiguous axial images were obtained from the base of the skull through the vertex without intravenous contrast. COMPARISON:  04/23/2019.  MRI, 04/28/2019 FINDINGS: Brain: Diffuse loss of the normal gray-white junction. There is sulcal effacement diffusely and near complete effacement of the basilar cisterns. Lateral ventricles are narrower on the prior exams. Third and fourth ventricles are mostly effaced. Cerebellar tonsils extend below the level of the foramen magnum. There are no focal areas of infarction. There are no parenchymal masses and there is no midline shift. No evidence of intracranial hemorrhage. Vascular: Unremarkable. Skull: Normal. Sinuses/Orbits: Normal globes and orbits. Clear sinuses and mastoid air cells. Other: None. IMPRESSION: 1. Extensive cerebral edema, now with loss of the normal gray-white junction, complete bilateral sulcal  effacement and effacement of the basilar cisterns, third ventricle and most of the fourth ventricle. Findings consistent with evolving diffuse anoxic brain injury. 2. No intracranial hemorrhage. Electronically Signed   By: Amie Portland M.D.   On: 05/19/2019 12:12   CT Head Wo Contrast  Result Date: 04/26/2019 CLINICAL DATA:  Altered mental status. EXAM: CT HEAD WITHOUT CONTRAST TECHNIQUE: Contiguous axial images were obtained from the base of the skull through the vertex without intravenous contrast. COMPARISON:  March 18, 2019 FINDINGS: Brain: No evidence of acute infarction, hemorrhage, hydrocephalus, extra-axial collection or mass lesion/mass effect. Mild diffuse loss of sulcal markings is noted when compared to the prior study. Vascular: No hyperdense vessel or unexpected calcification. Skull: Normal. Negative for fracture or focal lesion. Sinuses/Orbits: An 8 mm x 4 mm polyp versus mucous retention cyst is seen within the posteromedial aspect of the right maxillary sinus. Other: None. IMPRESSION: 1. Mild diffuse loss of sulcal markings when compared to the prior study dated March 18, 2019, without definite evidence of diffuse cerebral edema. MRI correlation is recommended. Electronically Signed   By: Aram Candela M.D.   On: 04/24/2019 20:30   CT CHEST WO CONTRAST  Result Date: 05/01/2019 CLINICAL DATA:  Hypoxemia.  Organ donor.  Anoxic brain injury. EXAM: CT CHEST WITHOUT CONTRAST TECHNIQUE: Multidetector CT imaging of the chest was performed following the standard protocol without IV contrast. COMPARISON:  April 30, 2019 FINDINGS: Cardiovascular: The heart size is normal. There is no significant pericardial effusion. There is a right-sided central venous catheter with tip terminating in the SVC. Mediastinum/Nodes: --No mediastinal or hilar lymphadenopathy. --No axillary lymphadenopathy. --No supraclavicular lymphadenopathy. --a left-sided thyroid nodule is noted measuring up to approximately 0.8  cm. Further follow-up is required. --the enteric tube courses through the esophagus and terminates likely in the stomach. Lungs/Pleura: Extensive consolidation is again noted bilaterally, greatest within the lower lobes and right upper lobe. There are likely trace bilateral pleural effusions. There is no pneumothorax. The trachea terminates  above the carina. Some debris is noted within the bilateral right left mainstem bronchi. Upper Abdomen: No acute abnormality. Musculoskeletal: No chest wall abnormality. No acute or significant osseous findings. Review of the MIP images confirms the above findings. IMPRESSION: Persistent multifocal airspace opacities consistent with multifocal pneumonia or aspiration. These have slightly improved since the prior study. Additional findings as detailed above. Electronically Signed   By: Katherine Mantle M.D.   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 Transplant donor evaluation. EXAM: CT CHEST, ABDOMEN AND PELVIS WITHOUT CONTRAST TECHNIQUE: Multidetector CT imaging of the chest, abdomen and pelvis was performed following the standard protocol without IV contrast. COMPARISON:  Portable chest earlier today. FINDINGS: CT CHEST FINDINGS Cardiovascular: No cardiomegaly or pericardial effusion. No aortic or coronary artery atherosclerosis is evident. Right subclavian approach central line. Vascular patency is not evaluated in the absence of IV contrast. Mediastinum/Nodes: No mediastinal lymphadenopathy. Enteric tube courses in the esophagus to the stomach. Lungs/Pleura: Endotracheal tube terminates above the carina. The central airways are patent but there are retained secretions in the bronchus intermedius. There is confluent bilateral lower lobe opacity with air bronchograms on the right. Similar confluent peribronchial opacity in the dependent right upper lobe.  Additional right upper lobe scattered peribronchial patchy opacity. Early peribronchial ground-glass opacity in the left upper lobe. No definite pleural fluid. Musculoskeletal: Negative. CT ABDOMEN PELVIS FINDINGS Hepatobiliary: Appearance of mild generalized gallbladder wall thickening or trace pericholecystic fluid on coronal image 38. But otherwise negative noncontrast liver and gallbladder. Pancreas: Negative. Spleen: Negative. Adrenals/Urinary Tract: Normal adrenal glands. Nonobstructed kidneys with decompressed ureters. No nephrolithiasis. Foley catheter within the bladder which is decompressed aside from some gas. Stomach/Bowel: Decompressed descending and rectosigmoid colon. Fluid in nondistended right and transverse colon. Evidence of a normal appendix. Fluid-filled but nondilated small bowel. Enteric tube loops in the stomach, terminating in the fundus with side hole at the proximal gastric body. Decompressed duodenum. No free air. No abdominal free fluid. Vascular/Lymphatic: Vascular patency is not evaluated in the absence of IV contrast. No atherosclerosis identified. No lymphadenopathy. Reproductive: Urethral catheter in place, otherwise negative. Other: Trace pelvic free fluid. Musculoskeletal: Negative. IMPRESSION: 1. Lines and tubes appear adequately placed. 2. Bilateral aspiration/pneumonia. Confluent bilateral lower lobe opacity and developing bilateral upper lobe bronchopneumonia greater on the right. No definite pleural effusion. 3. Mild nonspecific gallbladder wall thickening or pericholecystic fluid. 4. Trace pelvic free fluid, nonspecific. Electronically Signed   By: Odessa Fleming M.D.   On: 05/17/2019 23:31   CT Cervical Spine Wo Contrast  Result Date: 25-May-2019 CLINICAL DATA:  24 year old male with altered mental status. EXAM: CT CERVICAL SPINE WITHOUT CONTRAST TECHNIQUE: Multidetector CT imaging of the cervical spine was performed without intravenous contrast. Multiplanar CT image  reconstructions were also generated. COMPARISON:  None. FINDINGS: Evaluation of this exam is limited due to motion artifact. Alignment: No acute subluxation. Skull base and vertebrae: No definite acute fracture. Soft tissues and spinal canal: No prevertebral fluid or swelling. No visible canal hematoma. Disc levels: No acute findings. No significant degenerative changes. Upper chest: Confluent bilateral pulmonary opacities in the visualized lung apices concerning for multifocal pneumonia. Clinical correlation is recommended. Other: An endotracheal and an enteric tube are partially visualized. IMPRESSION: 1. No definite acute/traumatic cervical spine pathology. 2. Confluent bilateral pulmonary opacities concerning for multifocal pneumonia. Clinical correlation is recommended. Electronically Signed   By: Ceasar Mons.D.  On: 05/09/2019 20:30   MR BRAIN WO CONTRAST  Result Date: 04/28/2019 CLINICAL DATA:  Cardiac arrest.  Brain swelling by CT. EXAM: MRI HEAD WITHOUT CONTRAST TECHNIQUE: Multiplanar, multiecho pulse sequences of the brain and surrounding structures were obtained without intravenous contrast. COMPARISON:  Head CT 04/25/2019 FINDINGS: Brain: Findings suggest hypoxic ischemic brain injury. Abnormal early restricted diffusion and T2 signal affecting the basal ganglia/corpus striatum portion. Question early/mild diffuse cortical edema. No sign of mass lesion, hemorrhage, hydrocephalus or extra-axial collection. 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 Vascular: Major vessels at the base of the brain show flow. Skull and upper cervical spine: Negative Sinuses/Orbits: Clear/normal Other: None IMPRESSION: Probable early changes of hypoxic ischemic injury, manifest as abnormal early restricted diffusion and T2 signal within the caudate and putamen portion of the basal ganglia and probably affecting the diffuse cortical brain. Electronically Signed   By: Paulina Fusi M.D.   On:  04/28/2019 04:22   DG CHEST PORT 1 VIEW  Result Date: 05/01/2019 CLINICAL DATA:  Organ donation EXAM: PORTABLE CHEST 1 VIEW COMPARISON:  05/01/2019, 7:07 a.m., CT chest, 04/29/2019 FINDINGS: No significant change in AP portable examination with endotracheal tube and esophagogastric tube remaining in position. Bilateral heterogeneous airspace opacities are slightly improved. The heart and mediastinum are unremarkable. IMPRESSION: Slight interval improvement in heterogeneous airspace opacities, in keeping with multifocal airspace disease seen on prior CT. Electronically Signed   By: Lauralyn Primes M.D.   On: 05/01/2019 11:39   DG CHEST PORT 1 VIEW  Result Date: 05/03/2019 CLINICAL DATA:  Central line placement. Hypoxemic respiratory failure. EXAM: PORTABLE CHEST 1 VIEW COMPARISON:  05/19/2019 FINDINGS: Endotracheal tube tip is 6.6 cm above the carina. Nasogastric tube enters the stomach. Right central venous catheter tip projects over the SVC. No visible pneumothorax. Low lung volumes bilaterally. Indistinct left infrahilar opacity possibly from atelectasis or pneumonia. Heart size within normal limits. IMPRESSION: 1. Tubes and lines appear satisfactorily positioned. Right central line tip: SVC. No pneumothorax. 2. Left infrahilar airspace opacity potentially from atelectasis or pneumonia. 3. Low lung volumes. Electronically Signed   By: Gaylyn Rong M.D.   On: 05/01/2019 19:07   DG CHEST PORT 1 VIEW  Result Date: 04/28/2019 CLINICAL DATA:  Acute hypoxemic respiratory failure. EXAM: PORTABLE CHEST 1 VIEW COMPARISON:  05/08/2019 and 03/18/2019 FINDINGS: Cardiac silhouette normal in size.  No mediastinal or hilar masses. Thickened bronchial markings in the lung bases are more prominent than on the prior exam, particularly at the left lung base. Remainder of the lungs is clear. No convincing pleural effusion or pneumothorax. Endotracheal tube and nasal/orogastric tube are stable and well positioned.  IMPRESSION: 1. Thickened bronchial markings in the lung bases, most prominent on the left, appears mildly increased from the most recent prior exam, although the difference may be technical only. Consider acute bronchitis in the proper clinical setting. 2. Stable, well-positioned support apparatus. Electronically Signed   By: Amie Portland M.D.   On: 05/07/2019 05:35   DG Chest Portable 1 View  Result Date: 05/18/2019 CLINICAL DATA:  Status post intubation and nasogastric tube placement. EXAM: PORTABLE CHEST 1 VIEW COMPARISON:  March 18, 2019 FINDINGS: Since the prior study there is been placement of an endotracheal tube. Its distal tip is approximately 5.3 cm from the carina. Interval nasogastric tube placement is seen with its distal tip overlying the expected region of the gastric fundus. There is no evidence of acute infiltrate, pleural effusion or pneumothorax. The heart size and mediastinal contours  are within normal limits. The visualized skeletal structures are unremarkable. IMPRESSION: 1. Interval endotracheal tube and nasogastric tube placement and positioning, as described above, when compared to the prior study dated March 18, 2019. 2. No acute or active cardiopulmonary disease. Electronically Signed   By: Aram Candela M.D.   On: 05/04/2019 19:44   DG Abd Portable 1V  Result Date: 04/28/2019 CLINICAL DATA:  NG/OG tube placement. EXAM: PORTABLE ABDOMEN - 1 VIEW 6:07 a.m.: COMPARISON:  Portable abdomen x-ray earlier same day at 12:02 a.m. FINDINGS: NG/OG tube looped in the stomach with its tip in the mid body. Bowel gas pattern unremarkable. IMPRESSION: 1. NG/OG tube looped in the stomach with its tip in the mid body. 2. No acute abdominal abnormality. Electronically Signed   By: Hulan Saas M.D.   On: 04/28/2019 08:12   DG Abd Portable 1 View  Result Date: 04/28/2019 CLINICAL DATA:  MRI clearance EXAM: PORTABLE ABDOMEN - 1 VIEW COMPARISON:  None. FINDINGS: The bowel gas pattern is  nonobstructive. There is a large amount of stool throughout the visualized colon. The enteric tube tip projects over the gastric body. There is no metallic foreign body identified on this study. EKG leads project over the patient's abdomen and pelvis. IMPRESSION: No metallic foreign body identified on this study. The enteric tube tip projects over the gastric body. Large amount of stool throughout the colon. Electronically Signed   By: Katherine Mantle M.D.   On: 04/28/2019 00:14   EEG adult  Result Date: 04/28/2019 Charlsie Quest, MD     04/28/2019  2:41 PM Patient Name: KAESEN RODRIGUEZ MRN: [id number] Epilepsy Attending: Charlsie Quest Referring Physician/Provider: Emilie Rutter, PA Date: 04/28/2019 Duration: 27.456 mins Patient history: 23yo M found down and noted to have myoclonic jerks. EEG to evaluate for seizure Level of alertness: comatose AEDs during EEG study: Versed, propofol Technical aspects: This EEG study was done with scalp electrodes positioned according to the 10-20 International system of electrode placement. Electrical activity was acquired at a sampling rate of 500Hz  and reviewed with a high frequency filter of 70Hz  and a low frequency filter of 1Hz . EEG data were recorded continuously and digitally stored. DESCRIPTION: EEG showed generalized background suppression. Frequent episodes of axial jerk with eye opening were noted. Concomitant EEG showed generalized high amplitude polyspikes. Hyperventilation and photic stimulation were not performed. ABNORMALITY - Status myoclonus, generalized -Background suppression, generalized IMPRESSION: This study showed evidence of status myoclonus with generalized onset as well as profound diffuse encephalopathy, non specific to etiology. Dr was immediately notified. Priyanka   Overnight EEG with video  Result Date: 04/29/2019 Gaynell Face, MD     05/17/2019  8:12 AM Patient Name: ABRAN GAVIGAN MRN: Charlsie Quest Epilepsy  Attending: 06/30/2019 Referring Physician/Provider: Cristal Deer, NP Duration: 04/28/2019 1157 to 04/29/2019 1157  Patient history: 23yo M found down and noted to have myoclonic jerks. EEG to evaluate for seizure  Level of alertness: comatose  AEDs during EEG study: Versed, propofol, keppra, vpa  Technical aspects: This EEG study was done with scalp electrodes positioned according to the 10-20 International system of electrode placement. Electrical activity was acquired at a sampling rate of 500Hz  and reviewed with a high frequency filter of 70Hz  and a low frequency filter of 1Hz . EEG data were recorded continuously and digitally stored.  DESCRIPTION:  At the beginning of study, frequent episodes of axial jerk with eye opening were noted. Concomitant EEG showed generalized high amplitude  polyspikes consistent with status myoclonus, This improved by around 1230 on 04/28/2019. However, around 1700, frequent myoclonic seizures were noted again which imporved after 2100 on 04/28/2019. As sedation was increased, generalized polyspikes appeared less sharply contoured and lasted for 1-2 seconds only and eventually evolving into generalized maximal bifrontal periodic epileptiform discharges at 0.5-1Hz  after around 5am on 04/29/2019. After around 7am, eeg shows generalized background suppression with sharp waves in left frontotemporal region.  Hyperventilation and photic stimulation were not performed.  ABNORMALITY - Status myoclonus, generalized - Periodic epileptiform discharges, generalized - Sharp waves, left fronto-temporal region - Background suppression, generalized  IMPRESSION: This study initially showed evidence of status myoclonus with generalized onset. As sedation was increased, status myoclonus improved but eeg continued to show evidence of generalized epileptogenicity as well as profound diffuse encephalopathy, non specific to etiology.  Dr Aroor was intermittently notified.  Charlsie Questriyanka O Yadav    ECHOCARDIOGRAM COMPLETE  Result Date: 05/06/2019    ECHOCARDIOGRAM REPORT   Patient Name[facility]   [facility] Date of Exam: 05/05/2019 Medical Rec #:  [id number]         Height: Accession #:    [id number]        Weight:       231.9 lb Date of Birth:  06/25/1995          BSA:          2.229 m Patient Age:    23 years          BP:           117/75 mmHg Patient Gender: M                 HR:           76 bpm. Exam Location:  Inpatient Procedure: 2D Echo STAT ECHO Indications:    cardiac arrest. donor patient.  History:        Patient has no prior history of Echocardiogram examinations.                 Drug overdose.  Sonographer:    Delcie RochLauren Pennington Referring Phys: [id number] Charlina Dwight  Sonographer Comments: Echo performed with patient supine and on artificial respirator. Image acquisition challenging due to respiratory motion. IMPRESSIONS  1. Normal LV function; trace MR and TR.  2. Left ventricular ejection fraction, by estimation, is 55 to 60%. The left ventricle has normal function. The left ventricle has no regional wall motion abnormalities. Left ventricular diastolic parameters were normal.  3. Right ventricular systolic function is normal. The right ventricular size is normal.  4. The mitral valve is normal in structure. Trivial mitral valve regurgitation. No evidence of mitral stenosis.  5. The aortic valve is normal in structure. Aortic valve regurgitation is not visualized. No aortic stenosis is present.  6. The inferior vena cava is normal in size with greater than 50% respiratory variability, suggesting right atrial pressure of 3 mmHg. FINDINGS  Left Ventricle: Left ventricular ejection fraction, by estimation, is 55 to 60%. The left ventricle has normal function. The left ventricle has no regional wall motion abnormalities. The left ventricular internal cavity size was normal in size. There is  no left ventricular hypertrophy. Left ventricular diastolic parameters were normal. Right Ventricle: The  right ventricular size is normal.Right ventricular systolic function is normal. Left Atrium: Left atrial size was normal in size. Right Atrium: Right atrial size was normal in size. Pericardium: Trivial pericardial effusion is present. Mitral Valve: The mitral valve is  normal in structure. Normal mobility of the mitral valve leaflets. Trivial mitral valve regurgitation. No evidence of mitral valve stenosis. Tricuspid Valve: The tricuspid valve is normal in structure. Tricuspid valve regurgitation is trivial. No evidence of tricuspid stenosis. Aortic Valve: The aortic valve is normal in structure. Aortic valve regurgitation is not visualized. No aortic stenosis is present. Pulmonic Valve: The pulmonic valve was normal in structure. Pulmonic valve regurgitation is not visualized. No evidence of pulmonic stenosis. Aorta: The aortic root is normal in size and structure. Venous: The inferior vena cava is normal in size with greater than 50% respiratory variability, suggesting right atrial pressure of 3 mmHg. IAS/Shunts: No atrial level shunt detected by color flow Doppler. Additional Comments: Normal LV function; trace MR and TR.  LEFT VENTRICLE PLAX 2D LVIDd:         5.40 cm      Diastology LVIDs:         3.30 cm      LV e' lateral:   13.10 cm/s LV PW:         0.90 cm      LV E/e' lateral: 4.6 LV IVS:        0.80 cm      LV e' medial:    7.83 cm/s LVOT diam:     2.20 cm      LV E/e' medial:  7.8 LV SV:         52 LV SV Index:   23 LVOT Area:     3.80 cm  LV Volumes (MOD) LV vol d, MOD A2C: 109.0 ml LV vol d, MOD A4C: 81.6 ml LV vol s, MOD A2C: 43.6 ml LV vol s, MOD A4C: 43.0 ml LV SV MOD A2C:     65.4 ml LV SV MOD A4C:     81.6 ml LV SV MOD BP:      52.9 ml RIGHT VENTRICLE RV S prime:     12.10 cm/s TAPSE (M-mode): 2.2 cm LEFT ATRIUM             Index       RIGHT ATRIUM           Index LA diam:        3.30 cm 1.48 cm/m  RA Area:     12.30 cm LA Vol (A2C):   44.4 ml 19.92 ml/m RA Volume:   27.50 ml  12.34 ml/m LA  Vol (A4C):   36.5 ml 16.38 ml/m LA Biplane Vol: 42.0 ml 18.84 ml/m  AORTIC VALVE LVOT Vmax:   77.70 cm/s LVOT Vmean:  48.100 cm/s LVOT VTI:    0.136 m  AORTA Ao Root diam: 3.00 cm MITRAL VALVE MV Area (PHT): 2.80 cm    SHUNTS MV Decel Time: 271 msec    Systemic VTI:  0.14 m MV E velocity: 60.80 cm/s  Systemic Diam: 2.20 cm MV A velocity: 46.30 cm/s MV E/A ratio:  1.31 Olga Millers MD Electronically signed by Olga Millers MD Signature Date/Time: 05/07/2019/6:06:17 PM    Final     Microbiology No results found for this or any previous visit (from the past 240 hour(s)).  Lab Basic Metabolic Panel: No results for input(s): NA, K, CL, CO2, GLUCOSE, BUN, CREATININE, CALCIUM, MG, PHOS in the last 168 hours. Liver Function Tests: No results for input(s): AST, ALT, ALKPHOS, BILITOT, PROT, ALBUMIN in the last 168 hours. No results for input(s): LIPASE, AMYLASE in the last 168 hours. No results for input(s): AMMONIA in the  last 168 hours. CBC: No results for input(s): WBC, NEUTROABS, HGB, HCT, MCV, PLT in the last 168 hours. Cardiac Enzymes: No results for input(s): CKTOTAL, CKMB, CKMBINDEX, TROPONINI in the last 168 hours. Sepsis Labs: No results for input(s): PROCALCITON, WBC, LATICACIDVEN in the last 168 hours.  Procedures/Operations  Mechanical ventilation, echocardiography, central line and arterial line placement, flexible fiberoptic bronchoscopy, organ procurement.   Deon Duer 05/16/2019, 3:10 PM

## 2019-05-24 DEATH — deceased

## 2021-04-27 IMAGING — CT CT CERVICAL SPINE W/O CM
4 of 8 series · 11 of 34 positions shown, 12 images · non-contrast
Comparison: None.

CLINICAL DATA: 23-year-old male with altered mental status.

EXAM:
CT CERVICAL SPINE WITHOUT CONTRAST
TECHNIQUE: Multidetector CT imaging of the cervical spine was performed without
intravenous contrast. Multiplanar CT image reconstructions were also
generated.

[Series 8: c_spine 2.0 st · axial · 0.42mm/px · z∈[-464,-396]mm · 2 of 103 slices shown, 3 images (1 of 2)]
[im 35/103  soft-tissue]
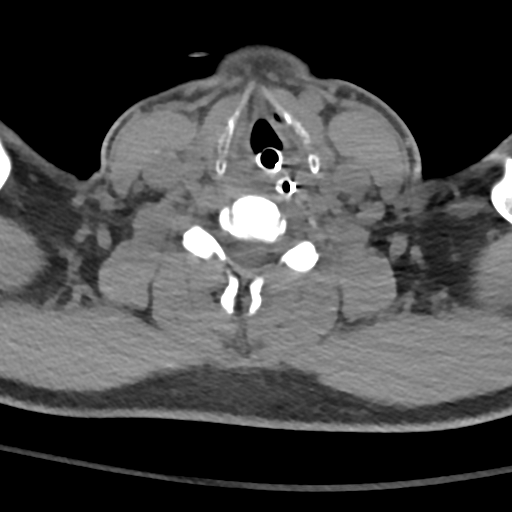
[im 35/103  bone]
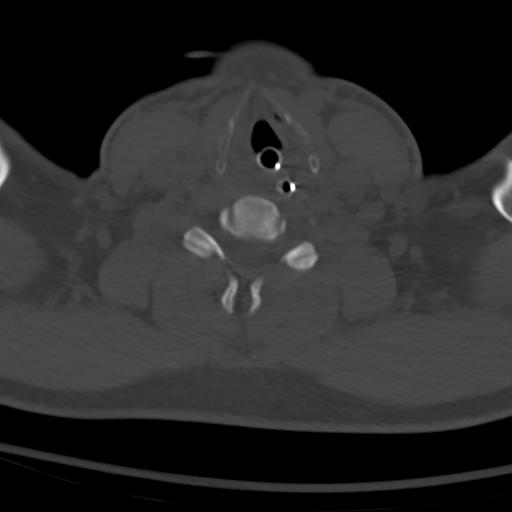
[im 69/103  bone]
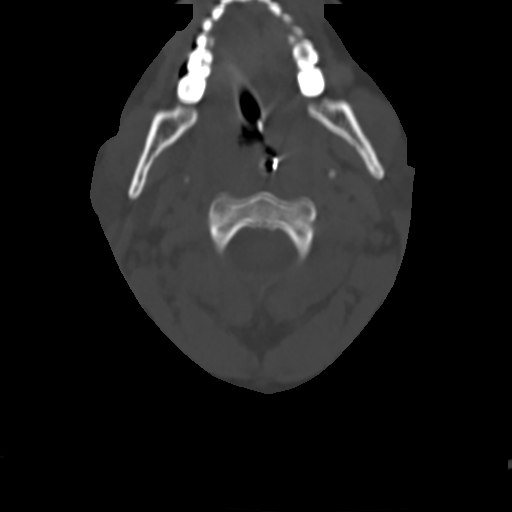

[Series 9: coronal bone · coronal · 0.27mm/px · 1 of 69 slices shown]
[im 35/69  bone]
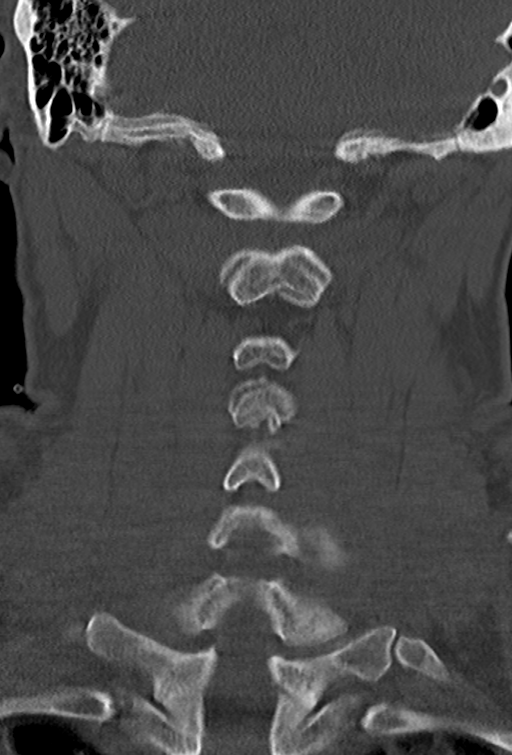

[Series 16: c_spine 2.0 st · axial · 0.42mm/px · z∈[-466,-400]mm · 2 of 101 slices shown (2 of 2)]
[im 34/101  bone]
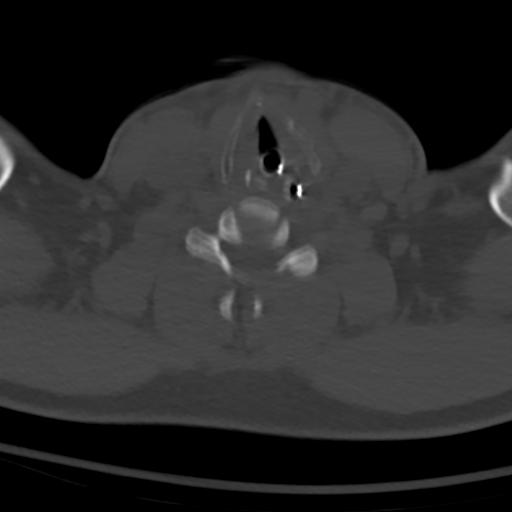
[im 67/101  bone]
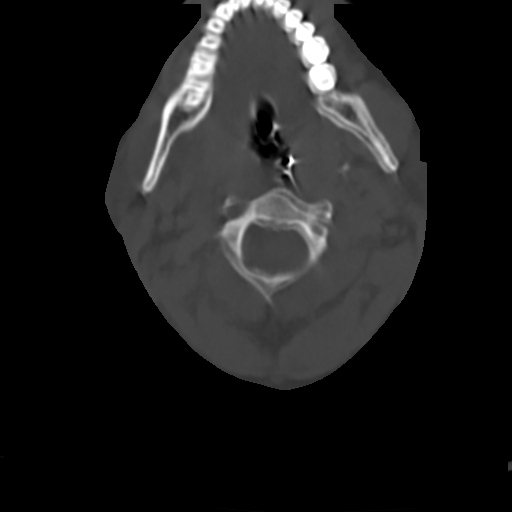

[Series 18: sagittal bone · sagittal · 0.30mm/px · 6 of 82 slices shown]
[im 14/82  bone]
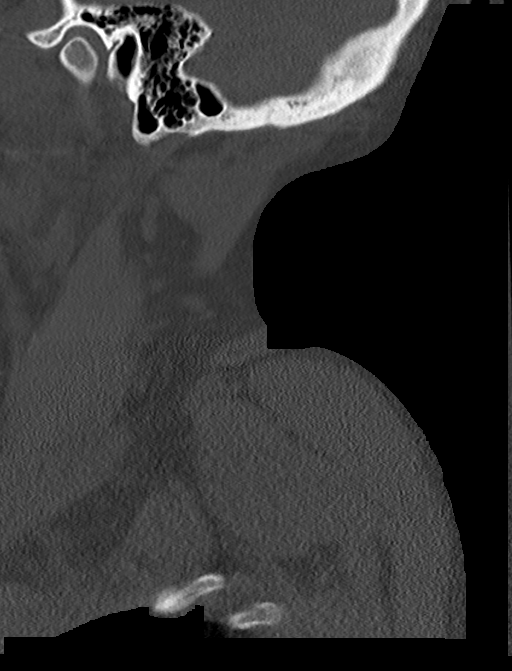
[im 19/82  soft-tissue]
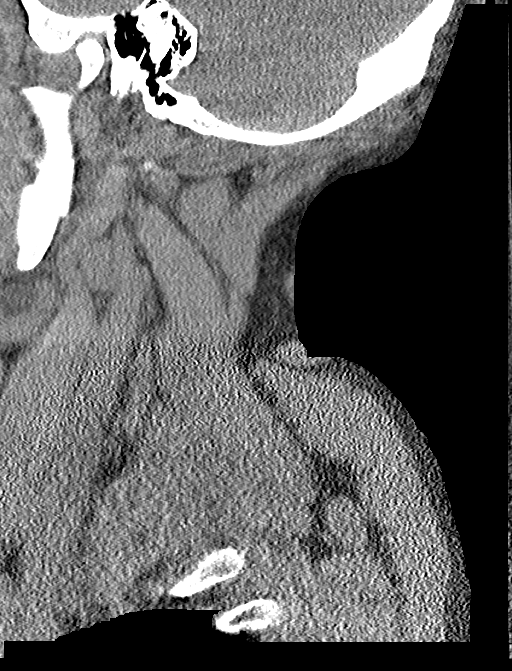
[im 28/82  bone]
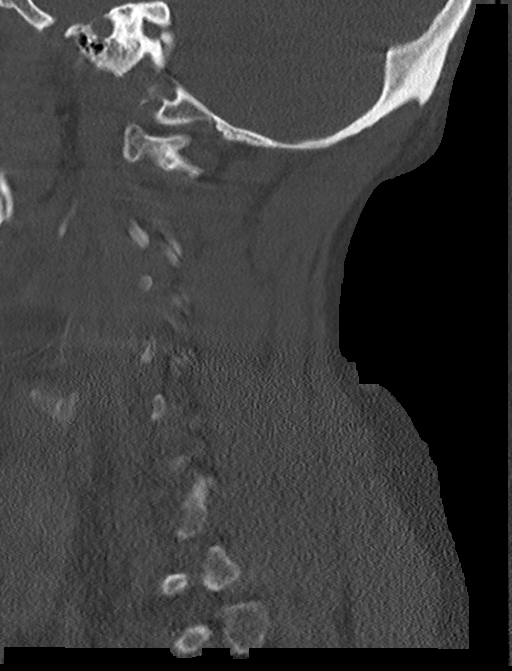
[im 41/82  bone]
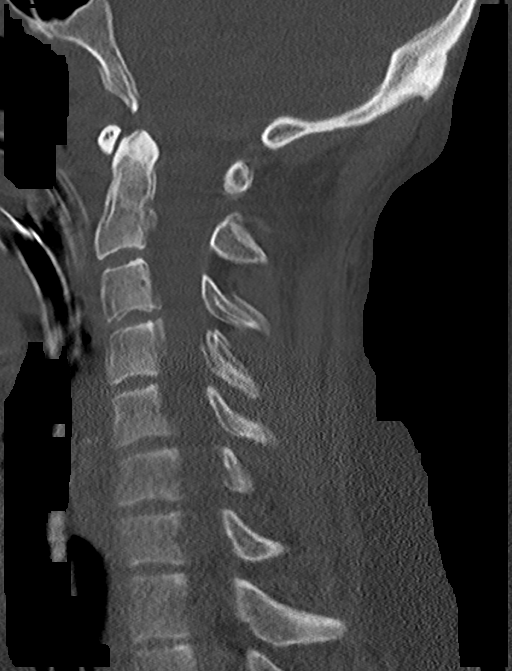
[im 55/82  bone]
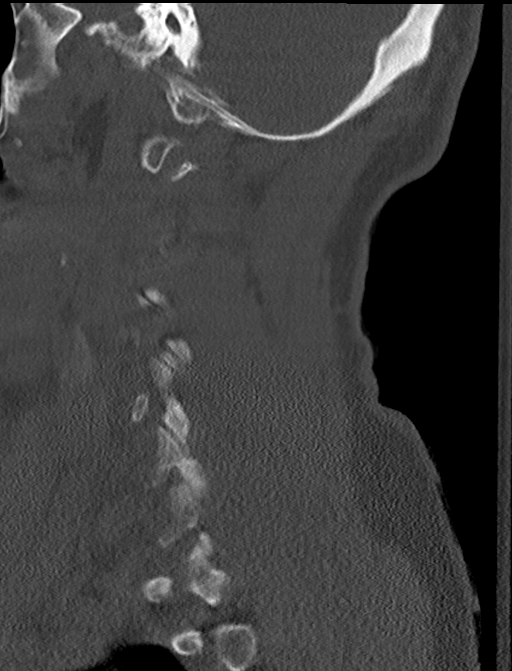
[im 68/82  bone]
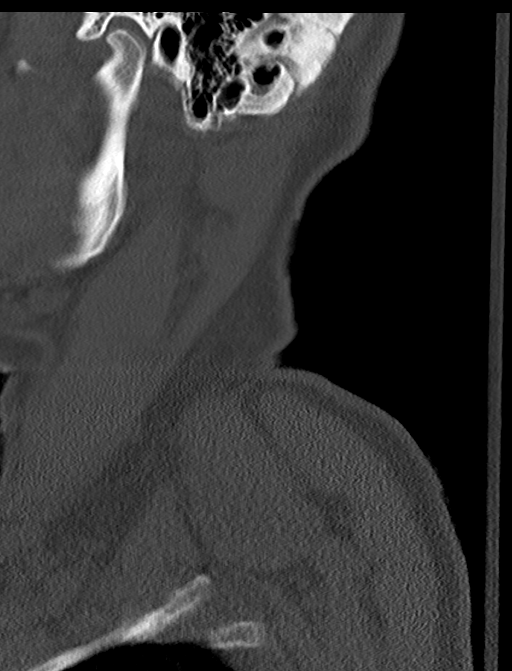

[11 of 34 positions shown; findings below may reference images not displayed]

FINDINGS: Evaluation of this exam is limited due to motion artifact.

Alignment: No acute subluxation.

Skull base and vertebrae: No definite acute fracture.

Soft tissues and spinal canal: No prevertebral fluid or swelling. No
visible canal hematoma.

Disc levels: No acute findings. No significant degenerative changes.

Upper chest: Confluent bilateral pulmonary opacities in the
visualized lung apices concerning for multifocal pneumonia. Clinical
correlation is recommended.

Other: An endotracheal and an enteric tube are partially visualized.
IMPRESSION: 1. No definite acute/traumatic cervical spine pathology.
2. Confluent bilateral pulmonary opacities concerning for multifocal
pneumonia. Clinical correlation is recommended.

## 2021-04-27 IMAGING — CT CT HEAD W/O CM
3 series · 16 of 47 positions shown, 19 images · non-contrast
Comparison: March 18, 2019

CLINICAL DATA: Altered mental status.

EXAM:
CT HEAD WITHOUT CONTRAST
TECHNIQUE: Contiguous axial images were obtained from the base of the skull
through the vertex without intravenous contrast.

[Series 3: head 5.0 h30s · axial · 0.43mm/px · z∈[-374,-240]mm · 10 of 33 slices shown, 13 images]
[im 3/33  brain]
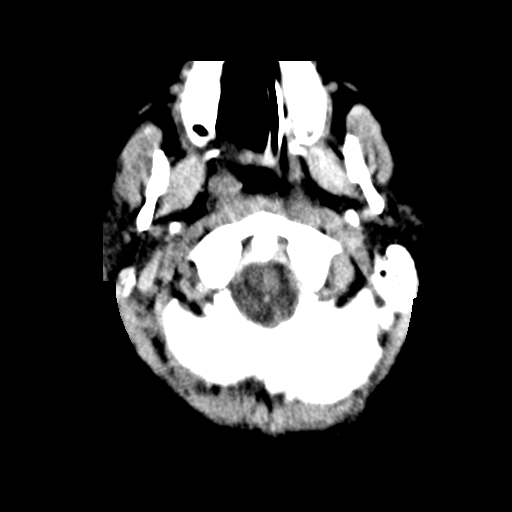
[im 3/33  bone]
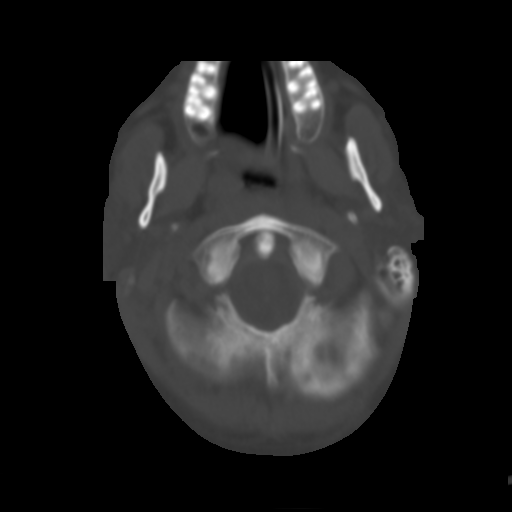
[im 6/33  brain]
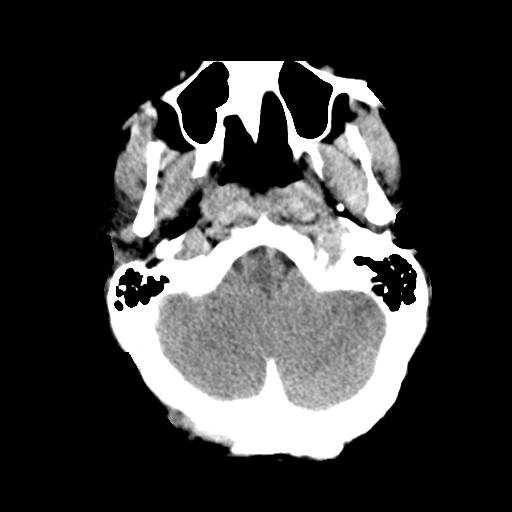
[im 9/33  brain]
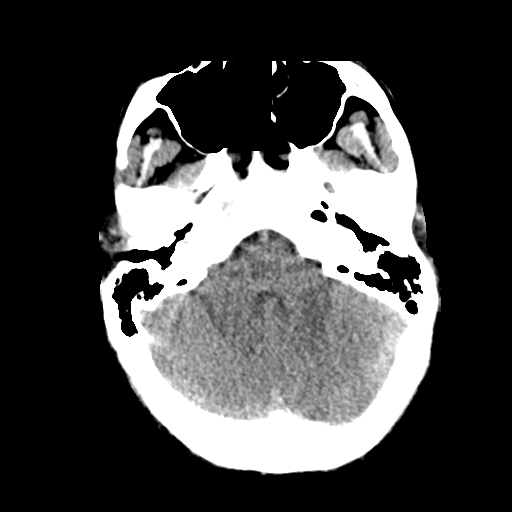
[im 12/33  brain]
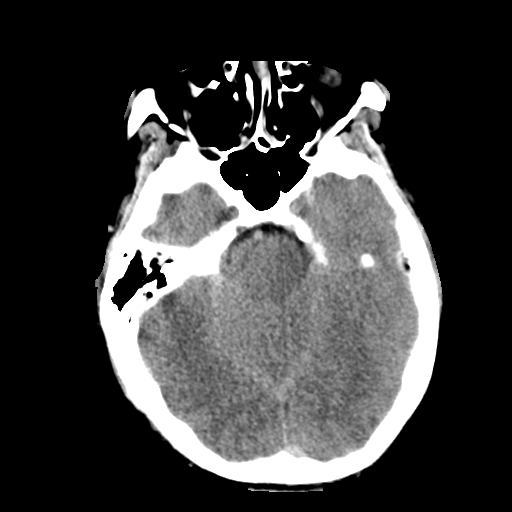
[im 15/33  brain]
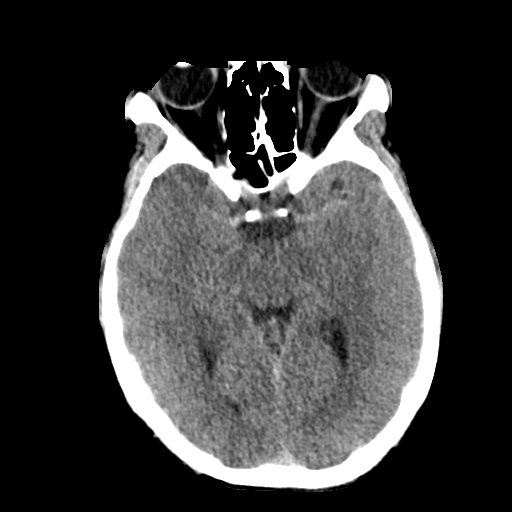
[im 15/33  bone]
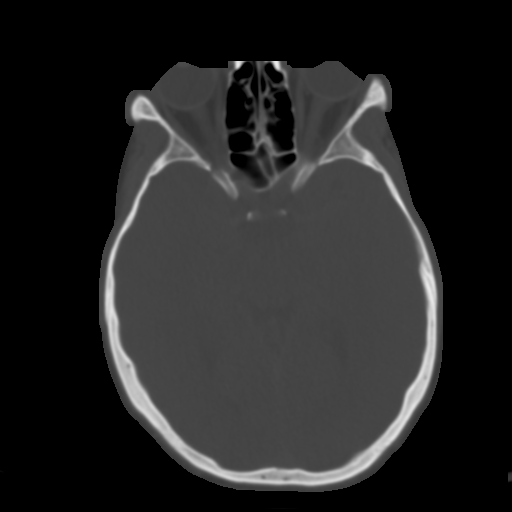
[im 18/33  brain]
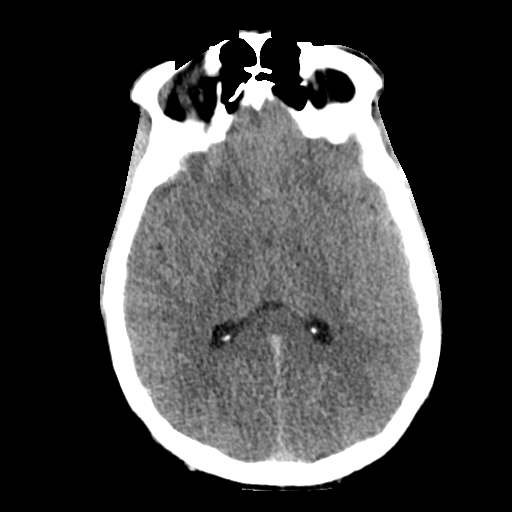
[im 21/33  brain]
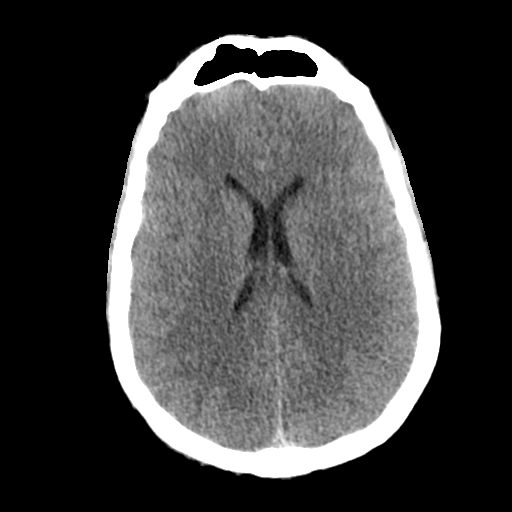
[im 25/33  brain]
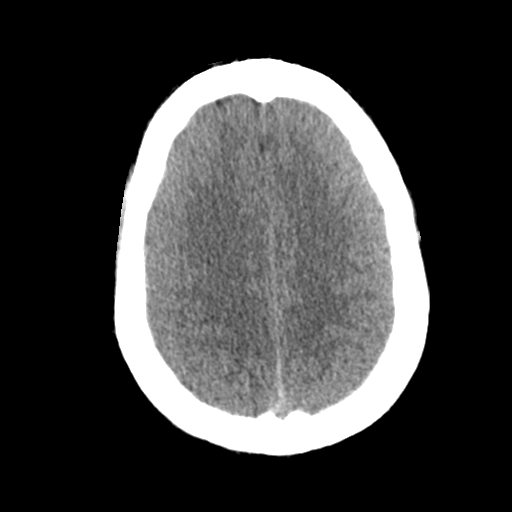
[im 27/33  brain]
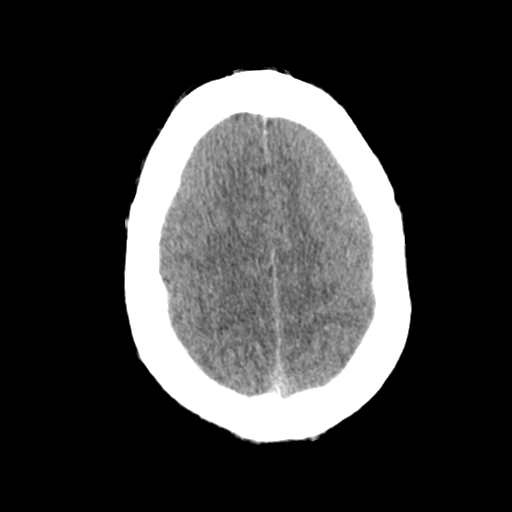
[im 27/33  bone]
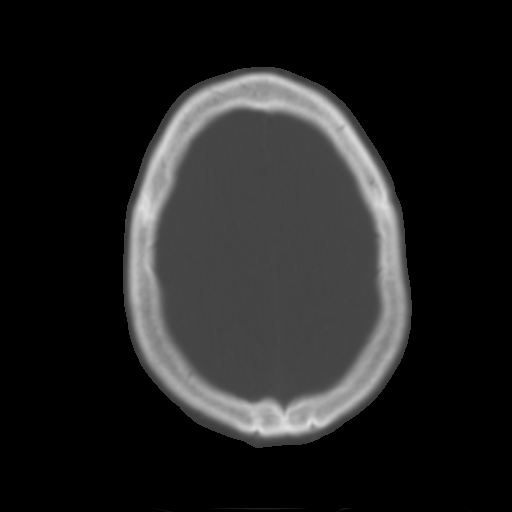
[im 30/33  brain]
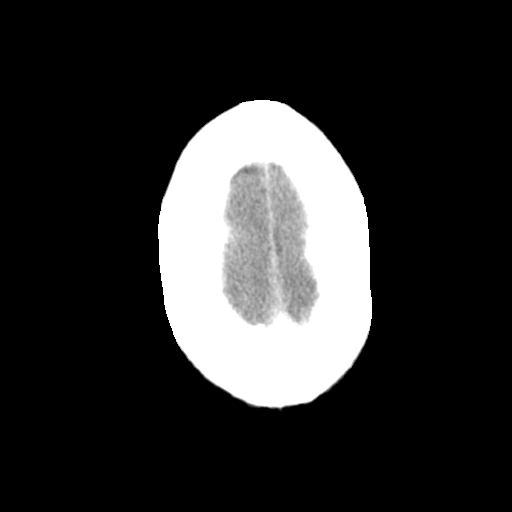

[Series 5: head 3.0 mpr cor · coronal · 0.33mm/px · 3 of 73 slices shown]
[im 25/73  brain]
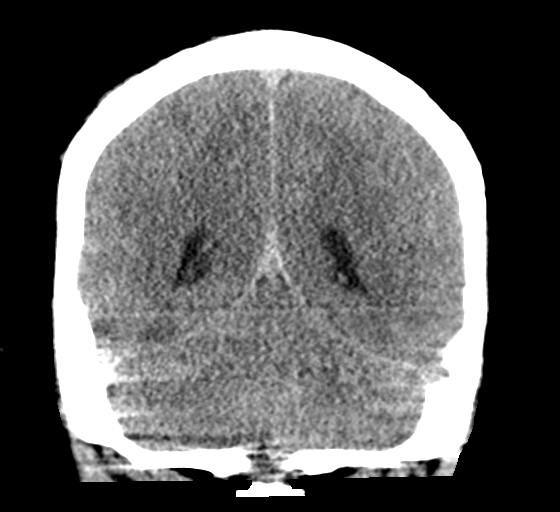
[im 33/73  brain]
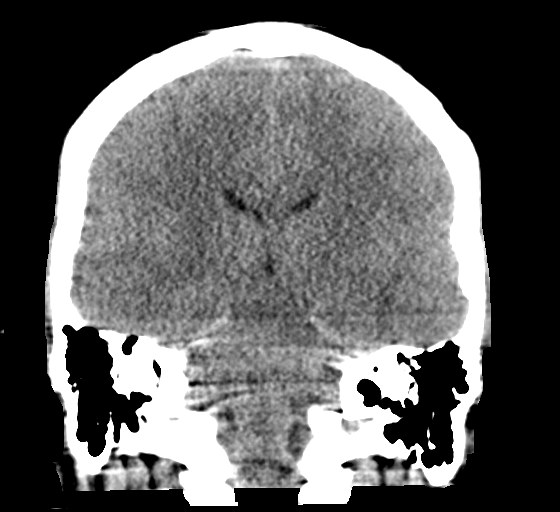
[im 41/73  brain]
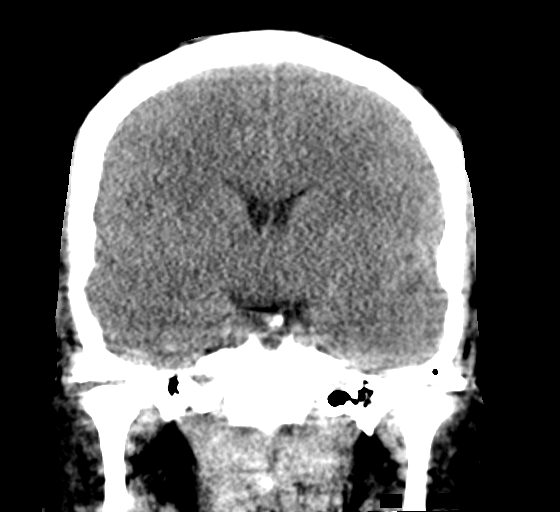

[Series 6: head 3.0 mpr sag · sagittal · 0.33mm/px · 3 of 60 slices shown]
[im 20/60  brain]
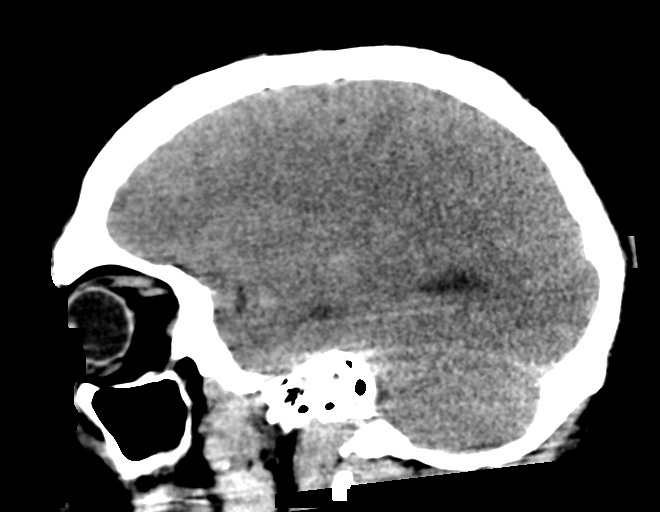
[im 30/60  brain]
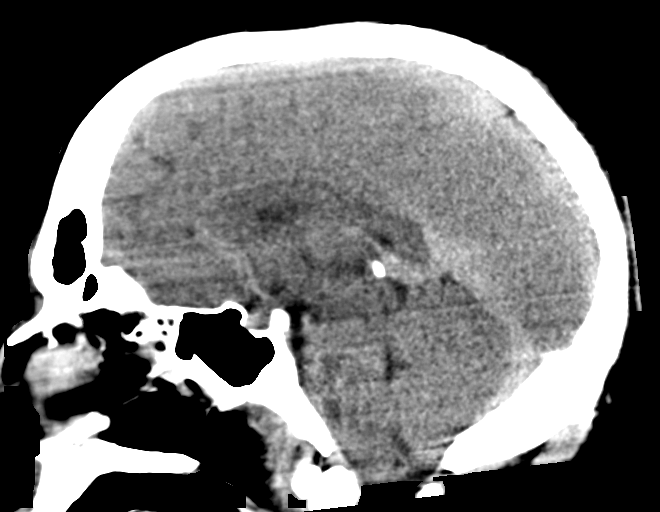
[im 40/60  brain]
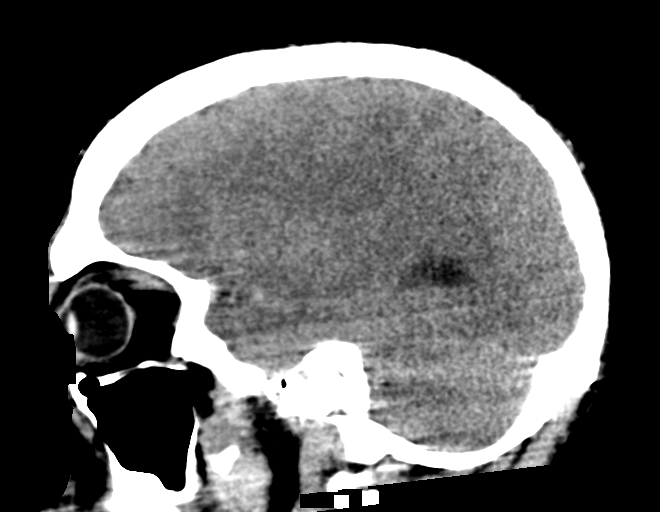

[16 of 47 positions shown; findings below may reference images not displayed]

FINDINGS: Brain: No evidence of acute infarction, hemorrhage, hydrocephalus,
extra-axial collection or mass lesion/mass effect. Mild diffuse loss
of sulcal markings is noted when compared to the prior study.

Vascular: No hyperdense vessel or unexpected calcification.

Skull: Normal. Negative for fracture or focal lesion.

Sinuses/Orbits: An 8 mm x 4 mm polyp versus mucous retention cyst is
seen within the posteromedial aspect of the right maxillary sinus.

Other: None.
IMPRESSION: 1. Mild diffuse loss of sulcal markings when compared to the prior
study dated March 18, 2019, without definite evidence of diffuse
cerebral edema. MRI correlation is recommended.
